# Patient Record
Sex: Male | Born: 1950 | Race: White | Hispanic: No | Marital: Married | Smoking: Never smoker
Health system: Southern US, Community
[De-identification: ages and names within clinical notes are randomized; demographics above are authoritative.]

## PROBLEM LIST (undated history)

## (undated) DIAGNOSIS — Z973 Presence of spectacles and contact lenses: Secondary | ICD-10-CM

## (undated) DIAGNOSIS — N400 Enlarged prostate without lower urinary tract symptoms: Secondary | ICD-10-CM

## (undated) DIAGNOSIS — I4819 Other persistent atrial fibrillation: Secondary | ICD-10-CM

## (undated) DIAGNOSIS — C61 Malignant neoplasm of prostate: Secondary | ICD-10-CM

## (undated) DIAGNOSIS — E785 Hyperlipidemia, unspecified: Secondary | ICD-10-CM

## (undated) DIAGNOSIS — R351 Nocturia: Secondary | ICD-10-CM

## (undated) DIAGNOSIS — G3184 Mild cognitive impairment, so stated: Secondary | ICD-10-CM

## (undated) HISTORY — DX: Other persistent atrial fibrillation: I48.19

## (undated) HISTORY — DX: Malignant neoplasm of prostate: C61

---
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
## 1994-02-12 HISTORY — PX: SEPTOPLASTY: SUR1290

## 2015-08-18 DIAGNOSIS — R350 Frequency of micturition: Secondary | ICD-10-CM | POA: Diagnosis not present

## 2015-08-18 DIAGNOSIS — Z125 Encounter for screening for malignant neoplasm of prostate: Secondary | ICD-10-CM | POA: Diagnosis not present

## 2015-08-18 DIAGNOSIS — Z8639 Personal history of other endocrine, nutritional and metabolic disease: Secondary | ICD-10-CM | POA: Diagnosis not present

## 2015-08-18 DIAGNOSIS — E7800 Pure hypercholesterolemia, unspecified: Secondary | ICD-10-CM | POA: Diagnosis not present

## 2015-08-18 DIAGNOSIS — Z1211 Encounter for screening for malignant neoplasm of colon: Secondary | ICD-10-CM | POA: Diagnosis not present

## 2015-08-18 DIAGNOSIS — Z1159 Encounter for screening for other viral diseases: Secondary | ICD-10-CM | POA: Diagnosis not present

## 2015-08-18 DIAGNOSIS — N401 Enlarged prostate with lower urinary tract symptoms: Secondary | ICD-10-CM | POA: Diagnosis not present

## 2015-08-18 DIAGNOSIS — Z01818 Encounter for other preprocedural examination: Secondary | ICD-10-CM | POA: Diagnosis not present

## 2015-08-26 DIAGNOSIS — N401 Enlarged prostate with lower urinary tract symptoms: Secondary | ICD-10-CM | POA: Diagnosis not present

## 2015-08-26 DIAGNOSIS — R300 Dysuria: Secondary | ICD-10-CM | POA: Diagnosis not present

## 2015-08-26 DIAGNOSIS — N3941 Urge incontinence: Secondary | ICD-10-CM | POA: Diagnosis not present

## 2015-08-26 DIAGNOSIS — R351 Nocturia: Secondary | ICD-10-CM | POA: Diagnosis not present

## 2015-09-06 DIAGNOSIS — N451 Epididymitis: Secondary | ICD-10-CM | POA: Diagnosis not present

## 2015-09-13 ENCOUNTER — Encounter (HOSPITAL_BASED_OUTPATIENT_CLINIC_OR_DEPARTMENT_OTHER): Payer: Self-pay | Admitting: *Deleted

## 2015-09-13 ENCOUNTER — Other Ambulatory Visit: Payer: Self-pay | Admitting: Urology

## 2015-09-13 DIAGNOSIS — R972 Elevated prostate specific antigen [PSA]: Secondary | ICD-10-CM | POA: Diagnosis not present

## 2015-09-13 DIAGNOSIS — C61 Malignant neoplasm of prostate: Secondary | ICD-10-CM | POA: Diagnosis not present

## 2015-09-13 NOTE — Progress Notes (Signed)
NPO AFTER MN.  ARRIVE AT 0930.  NEEDS HG.  WILL TAKE FLOMAX AM DOS W/ SIPS OF WATER.

## 2015-09-15 ENCOUNTER — Ambulatory Visit (HOSPITAL_BASED_OUTPATIENT_CLINIC_OR_DEPARTMENT_OTHER)
Admission: RE | Admit: 2015-09-15 | Discharge: 2015-09-15 | Disposition: A | Discharge status: Home / Self Care | Payer: PPO | Source: Ambulatory Visit | Attending: Urology | Admitting: Urology

## 2015-09-15 ENCOUNTER — Ambulatory Visit (HOSPITAL_BASED_OUTPATIENT_CLINIC_OR_DEPARTMENT_OTHER): Payer: PPO | Admitting: Anesthesiology

## 2015-09-15 ENCOUNTER — Encounter (HOSPITAL_BASED_OUTPATIENT_CLINIC_OR_DEPARTMENT_OTHER): Payer: Self-pay | Admitting: Anesthesiology

## 2015-09-15 ENCOUNTER — Encounter (HOSPITAL_BASED_OUTPATIENT_CLINIC_OR_DEPARTMENT_OTHER)
Admission: RE | Disposition: A | Discharge status: Home / Self Care | Payer: Self-pay | Source: Ambulatory Visit | Attending: Urology

## 2015-09-15 ENCOUNTER — Other Ambulatory Visit: Payer: Self-pay

## 2015-09-15 DIAGNOSIS — Z7982 Long term (current) use of aspirin: Secondary | ICD-10-CM | POA: Diagnosis not present

## 2015-09-15 DIAGNOSIS — N400 Enlarged prostate without lower urinary tract symptoms: Secondary | ICD-10-CM | POA: Diagnosis not present

## 2015-09-15 DIAGNOSIS — C61 Malignant neoplasm of prostate: Secondary | ICD-10-CM | POA: Diagnosis not present

## 2015-09-15 DIAGNOSIS — I4891 Unspecified atrial fibrillation: Secondary | ICD-10-CM | POA: Diagnosis not present

## 2015-09-15 DIAGNOSIS — R3914 Feeling of incomplete bladder emptying: Secondary | ICD-10-CM | POA: Diagnosis not present

## 2015-09-15 DIAGNOSIS — N3941 Urge incontinence: Secondary | ICD-10-CM | POA: Diagnosis not present

## 2015-09-15 DIAGNOSIS — R300 Dysuria: Secondary | ICD-10-CM | POA: Diagnosis not present

## 2015-09-15 DIAGNOSIS — N138 Other obstructive and reflux uropathy: Secondary | ICD-10-CM | POA: Insufficient documentation

## 2015-09-15 DIAGNOSIS — Z79899 Other long term (current) drug therapy: Secondary | ICD-10-CM | POA: Insufficient documentation

## 2015-09-15 DIAGNOSIS — N359 Urethral stricture, unspecified: Secondary | ICD-10-CM | POA: Insufficient documentation

## 2015-09-15 DIAGNOSIS — R351 Nocturia: Secondary | ICD-10-CM | POA: Diagnosis not present

## 2015-09-15 DIAGNOSIS — N401 Enlarged prostate with lower urinary tract symptoms: Secondary | ICD-10-CM | POA: Insufficient documentation

## 2015-09-15 DIAGNOSIS — N411 Chronic prostatitis: Secondary | ICD-10-CM | POA: Diagnosis not present

## 2015-09-15 HISTORY — DX: Nocturia: R35.1

## 2015-09-15 HISTORY — DX: Presence of spectacles and contact lenses: Z97.3

## 2015-09-15 HISTORY — DX: Hyperlipidemia, unspecified: E78.5

## 2015-09-15 HISTORY — PX: TRANSURETHRAL RESECTION OF PROSTATE: SHX73

## 2015-09-15 HISTORY — DX: Benign prostatic hyperplasia without lower urinary tract symptoms: N40.0

## 2015-09-15 LAB — POCT HEMOGLOBIN-HEMACUE: Hemoglobin: 13.7 g/dL (ref 13.0–17.0)

## 2015-09-15 SURGERY — TURP (TRANSURETHRAL RESECTION OF PROSTATE)
Anesthesia: General | Site: Prostate

## 2015-09-15 MED ORDER — DEXAMETHASONE SODIUM PHOSPHATE 10 MG/ML IJ SOLN
INTRAMUSCULAR | Status: AC
  Filled 2015-09-15: qty 1

## 2015-09-15 MED ORDER — PROPOFOL 10 MG/ML IV BOLUS
INTRAVENOUS | Status: DC | PRN
  Administered 2015-09-15: 140 mg via INTRAVENOUS

## 2015-09-15 MED ORDER — KETOROLAC TROMETHAMINE 30 MG/ML IJ SOLN
INTRAMUSCULAR | Status: AC
  Filled 2015-09-15: qty 1

## 2015-09-15 MED ORDER — FENTANYL CITRATE (PF) 100 MCG/2ML IJ SOLN
INTRAMUSCULAR | Status: AC
  Filled 2015-09-15: qty 2

## 2015-09-15 MED ORDER — PHENYLEPHRINE HCL 10 MG/ML IJ SOLN
INTRAMUSCULAR | Status: DC | PRN
  Administered 2015-09-15 (×3): 120 ug via INTRAVENOUS

## 2015-09-15 MED ORDER — PHENYLEPHRINE HCL 10 MG/ML IJ SOLN
INTRAVENOUS | Status: DC | PRN
  Administered 2015-09-15: 100 ug/min via INTRAVENOUS

## 2015-09-15 MED ORDER — CEFAZOLIN SODIUM-DEXTROSE 2-4 GM/100ML-% IV SOLN
INTRAVENOUS | Status: AC
Start: 2015-09-15 — End: 2015-09-15
  Filled 2015-09-15: qty 100

## 2015-09-15 MED ORDER — BELLADONNA ALKALOIDS-OPIUM 16.2-60 MG RE SUPP
RECTAL | Status: DC | PRN
  Administered 2015-09-15: 1 via RECTAL

## 2015-09-15 MED ORDER — KETOROLAC TROMETHAMINE 30 MG/ML IJ SOLN
30.0000 mg | Freq: Once | INTRAMUSCULAR | Status: AC
  Administered 2015-09-15: 30 mg via INTRAVENOUS
  Filled 2015-09-15: qty 1

## 2015-09-15 MED ORDER — MIDAZOLAM HCL 5 MG/5ML IJ SOLN
INTRAMUSCULAR | Status: DC | PRN
  Administered 2015-09-15 (×2): 1 mg via INTRAVENOUS

## 2015-09-15 MED ORDER — LIDOCAINE HCL (CARDIAC) 20 MG/ML IV SOLN
INTRAVENOUS | Status: DC | PRN
  Administered 2015-09-15: 80 mg via INTRAVENOUS

## 2015-09-15 MED ORDER — PHENYLEPHRINE 40 MCG/ML (10ML) SYRINGE FOR IV PUSH (FOR BLOOD PRESSURE SUPPORT)
PREFILLED_SYRINGE | INTRAVENOUS | Status: AC
  Filled 2015-09-15: qty 10

## 2015-09-15 MED ORDER — ACETAMINOPHEN 500 MG PO TABS
ORAL_TABLET | ORAL | Status: AC
  Filled 2015-09-15: qty 2

## 2015-09-15 MED ORDER — LIDOCAINE HCL 2 % EX GEL
CUTANEOUS | Status: DC | PRN
  Administered 2015-09-15: 1 via URETHRAL

## 2015-09-15 MED ORDER — CEFAZOLIN SODIUM-DEXTROSE 2-4 GM/100ML-% IV SOLN
2.0000 g | INTRAVENOUS | Status: AC
  Administered 2015-09-15: 2 g via INTRAVENOUS
  Filled 2015-09-15: qty 100

## 2015-09-15 MED ORDER — SODIUM CHLORIDE 0.9 % IR SOLN
Status: DC | PRN
  Administered 2015-09-15 (×2): 3000 mL via INTRAVESICAL

## 2015-09-15 MED ORDER — ONDANSETRON HCL 4 MG/2ML IJ SOLN
INTRAMUSCULAR | Status: AC
  Filled 2015-09-15: qty 2

## 2015-09-15 MED ORDER — PROMETHAZINE HCL 25 MG/ML IJ SOLN
6.2500 mg | INTRAMUSCULAR | Status: DC | PRN
  Filled 2015-09-15: qty 1

## 2015-09-15 MED ORDER — MIDAZOLAM HCL 2 MG/2ML IJ SOLN
INTRAMUSCULAR | Status: AC
  Filled 2015-09-15: qty 2

## 2015-09-15 MED ORDER — OXYCODONE-ACETAMINOPHEN 5-325 MG PO TABS: 1.0000 | ORAL_TABLET | Freq: Four times a day (QID) | ORAL | 0 refills | Status: DC | PRN

## 2015-09-15 MED ORDER — ACETAMINOPHEN 500 MG PO TABS
1000.0000 mg | ORAL_TABLET | Freq: Once | ORAL | Status: AC
  Administered 2015-09-15: 1000 mg via ORAL
  Filled 2015-09-15: qty 2

## 2015-09-15 MED ORDER — BACITRACIN-NEOMYCIN-POLYMYXIN 400-5-5000 EX OINT: 1.0000 "application " | TOPICAL_OINTMENT | Freq: Two times a day (BID) | CUTANEOUS | 0 refills | Status: DC

## 2015-09-15 MED ORDER — BELLADONNA ALKALOIDS-OPIUM 16.2-60 MG RE SUPP
RECTAL | Status: AC
  Filled 2015-09-15: qty 1

## 2015-09-15 MED ORDER — LIDOCAINE HCL (CARDIAC) 20 MG/ML IV SOLN
INTRAVENOUS | Status: AC
  Filled 2015-09-15: qty 5

## 2015-09-15 MED ORDER — FENTANYL CITRATE (PF) 100 MCG/2ML IJ SOLN
25.0000 ug | INTRAMUSCULAR | Status: DC | PRN
  Filled 2015-09-15: qty 1

## 2015-09-15 MED ORDER — ONDANSETRON HCL 4 MG/2ML IJ SOLN
INTRAMUSCULAR | Status: DC | PRN
  Administered 2015-09-15: 4 mg via INTRAVENOUS

## 2015-09-15 MED ORDER — UROGESIC-BLUE 81.6 MG PO TABS: ORAL_TABLET | ORAL | 3 refills | Status: DC

## 2015-09-15 MED ORDER — DOXYCYCLINE HYCLATE 50 MG PO CAPS: 100.0000 mg | ORAL_CAPSULE | Freq: Two times a day (BID) | ORAL | 0 refills | Status: DC

## 2015-09-15 MED ORDER — DEXAMETHASONE SODIUM PHOSPHATE 4 MG/ML IJ SOLN
INTRAMUSCULAR | Status: DC | PRN
  Administered 2015-09-15: 10 mg via INTRAVENOUS

## 2015-09-15 MED ORDER — PHENYLEPHRINE HCL 10 MG/ML IJ SOLN
INTRAMUSCULAR | Status: AC
Start: 2015-09-15 — End: 2015-09-15
  Filled 2015-09-15: qty 1

## 2015-09-15 MED ORDER — FENTANYL CITRATE (PF) 100 MCG/2ML IJ SOLN
INTRAMUSCULAR | Status: DC | PRN
  Administered 2015-09-15: 25 ug via INTRAVENOUS

## 2015-09-15 MED ORDER — LACTATED RINGERS IV SOLN
INTRAVENOUS | Status: DC
  Administered 2015-09-15: 10:00:00 via INTRAVENOUS
  Filled 2015-09-15: qty 1000

## 2015-09-15 MED ORDER — HYOSCYAMINE SULFATE 0.125 MG SL SUBL: 0.1250 mg | SUBLINGUAL_TABLET | SUBLINGUAL | 6 refills | Status: DC | PRN

## 2015-09-15 SURGICAL SUPPLY — 20 items
BAG DRAIN URO-CYSTO SKYTR STRL (DRAIN) ×18 IMPLANT
BAG URINE DRAINAGE (UROLOGICAL SUPPLIES) ×3 IMPLANT
CATH FOLEY 2WAY SLVR  5CC 20FR (CATHETERS) ×2
CATH FOLEY 2WAY SLVR 5CC 20FR (CATHETERS) ×1 IMPLANT
CLOTH BEACON ORANGE TIMEOUT ST (SAFETY) ×3 IMPLANT
GLOVE BIO SURGEON STRL SZ7.5 (GLOVE) ×3 IMPLANT
GOWN STRL REUS W/ TWL LRG LVL3 (GOWN DISPOSABLE) ×1 IMPLANT
GOWN STRL REUS W/ TWL XL LVL3 (GOWN DISPOSABLE) ×1 IMPLANT
GOWN STRL REUS W/TWL LRG LVL3 (GOWN DISPOSABLE) ×2
GOWN STRL REUS W/TWL XL LVL3 (GOWN DISPOSABLE) ×2
HOLDER FOLEY CATH W/STRAP (MISCELLANEOUS) ×3 IMPLANT
KIT ROOM TURNOVER WOR (KITS) ×3 IMPLANT
LOOP CUT BIPOLAR 24F LRG (ELECTROSURGICAL) ×3 IMPLANT
MANIFOLD NEPTUNE II (INSTRUMENTS) ×3 IMPLANT
PACK CYSTO (CUSTOM PROCEDURE TRAY) ×3 IMPLANT
SET ASPIRATION TUBING (TUBING) ×3 IMPLANT
SYR 30ML LL (SYRINGE) ×3 IMPLANT
SYRINGE IRR TOOMEY STRL 70CC (SYRINGE) ×3 IMPLANT
TUBE CONNECTING 12'X1/4 (SUCTIONS) ×1
TUBE CONNECTING 12X1/4 (SUCTIONS) ×2 IMPLANT

## 2015-09-15 NOTE — Anesthesia Preprocedure Evaluation (Addendum)
Anesthesia Evaluation  Patient identified by MRN, date of birth, ID band Patient awake    Reviewed: Allergy & Precautions, NPO status , Patient's Chart, lab work & pertinent test results  Airway Mallampati: II  TM Distance: >3 FB Neck ROM: Full    Dental  (+) Dental Advisory Given,    Pulmonary neg pulmonary ROS,    breath sounds clear to auscultation       Cardiovascular Exercise Tolerance: Good negative cardio ROS  + dysrhythmias Atrial Fibrillation  Rhythm:Regular Rate:Normal     Neuro/Psych negative neurological ROS     GI/Hepatic negative GI ROS, Neg liver ROS,   Endo/Other  negative endocrine ROS  Renal/GU negative Renal ROS     Musculoskeletal   Abdominal   Peds  Hematology negative hematology ROS (+)   Anesthesia Other Findings   Reproductive/Obstetrics                            Anesthesia Physical Anesthesia Plan  ASA: II  Anesthesia Plan: General   Post-op Pain Management:    Induction: Intravenous  Airway Management Planned: LMA  Additional Equipment:   Intra-op Plan:   Post-operative Plan: Extubation in OR  Informed Consent: I have reviewed the patients History and Physical, chart, labs and discussed the procedure including the risks, benefits and alternatives for the proposed anesthesia with the patient or authorized representative who has indicated his/her understanding and acceptance.   Dental advisory given  Plan Discussed with: CRNA  Anesthesia Plan Comments:         Anesthesia Quick Evaluation

## 2015-09-15 NOTE — Anesthesia Procedure Notes (Signed)
Procedure Name: LMA Insertion Date/Time: 09/15/2015 10:40 AM Performed by: Wanita Chamberlain Pre-anesthesia Checklist: Patient identified, Emergency Drugs available, Suction available, Patient being monitored and Timeout performed Patient Re-evaluated:Patient Re-evaluated prior to inductionOxygen Delivery Method: Circle system utilized Preoxygenation: Pre-oxygenation with 100% oxygen Intubation Type: IV induction Ventilation: Mask ventilation without difficulty LMA: LMA inserted LMA Size: 5.0 Number of attempts: 1 Placement Confirmation: CO2 detector and breath sounds checked- equal and bilateral Tube secured with: Tape Dental Injury: Teeth and Oropharynx as per pre-operative assessment

## 2015-09-15 NOTE — H&P (Signed)
Office Visit Report     08/26/2015   --------------------------------------------------------------------------------   Brady Reedy. Martinez  MRN: [id number]  PRIMARY CARE:  Brady Martinez. 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    LOCATION:  [facility] [phone]   --------------------------------------------------------------------------------   CC: I have symptoms of an enlarged prostate.  HPI: Brady Martinez is a 65 year-old male established patient who is here for symptoms of enlarged prostate.  He first noticed the symptoms approximately 08/12/2013. His symptoms have gotten worse over the last year. He has been treated with Flomax and Rapaflo. The patient has never had a surgical procedure for bladder outlet obstruction to his prostate.   He usually gets up at night to urinate 1 time. He does not have to wait a long time to start his urinary stream. He does not have to strain or bear down to start his urinary stream. He does have a good size and strength to his urinary stream. He does not dribble at the end of urination. He is having problems with emptying his bladder well.   He has not previously had an indwelling catheter in for more than two weeks at a time.   He has had a PSA done. He does not have any family members who have been diagnosed with prostate cancer.     AUA Symptom Score: He never has the sensation of not emptying his bladder completely after finishing urinating. 50% of the time he has to urinate again fewer than two hours after he has finished urinating. 50% of the time he has to start and stop again several times when he urinates. 50% of the time he finds it difficult to postpone urination. 50% of the time he has a weak urinary stream. Less than 50% of the time he has to push or strain to begin urination. He has to get up to urinate 2 times from the time he goes to bed until the time he  gets up in the morning.   Calculated AUA Symptom Score: 16    QOL Score: He would feel mostly dissatisfied if he had to live with his urinary condition the way it is now for the rest of his life.   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, unspecified, Inability to maintain erection - 09/30/2014 Urge incontinence, Urge incontinence of urine - 09/30/2014 Urgency of urination, Urinary urgency - 09/30/2014    NON-GU PMH: Encounter for general adult medical examination without abnormal findings, Encounter for preventive health examination - 09/27/2014 Personal history of other endocrine, nutritional and metabolic disease, History of hyperlipidemia - 08/27/2014    FAMILY HISTORY: Acute Myocardial Infarction - Runs In Family CAD (coronary artery disease) - Runs In Family Deceased - Runs In Family   SOCIAL HISTORY: Marital Status: Married Current Smoking Status: Patient has never smoked.  Has  never drank.  Drinks 4+ caffeinated drinks per day.     Notes: Caffeine use, No alcohol use, Retired, Never a smoker, Married   REVIEW OF SYSTEMS:    GU Review Male:   Patient reports frequent urination, hard to postpone urination, burning/ pain with urination, get up at night to urinate, leakage of urine, and stream starts and stops. Patient denies trouble starting your stream, have to strain to urinate , erection problems, and penile pain.  Gastrointestinal (Upper):   Patient denies nausea, vomiting, and indigestion/ heartburn.  Gastrointestinal  (Lower):   Patient denies diarrhea and constipation.  Constitutional:   Patient denies fever, night sweats, weight loss, and fatigue.  Skin:   Patient denies skin rash/ lesion and itching.  Eyes:   Patient denies blurred vision and double vision.  Ears/ Nose/ Throat:   Patient denies sore throat and sinus problems.  Hematologic/Lymphatic:   Patient denies easy bruising and swollen glands.  Cardiovascular:   Patient denies leg swelling and chest pains.  Respiratory:   Patient denies cough and shortness of breath.  Endocrine:   Patient denies excessive thirst.  Musculoskeletal:   Patient denies back pain and joint pain.  Neurological:   Patient denies headaches and dizziness.  Psychologic:   Patient denies depression and anxiety.   VITAL SIGNS:      08/26/2015 04:19 PM  BP 119/70 mmHg  Pulse 71 /min  Temperature 98.2 F / 37 C   GU PHYSICAL EXAMINATION:    Anus and Perineum: No hemorrhoids. No anal stenosis. No rectal fissure, no anal fissure. No edema, no dimple, no perineal tenderness, no anal tenderness.  Scrotum: No lesions. No edema. No cysts. No warts.  Epididymides: Right: no spermatocele, no masses, no cysts, no tenderness, no induration, no enlargement. Left: no spermatocele, no masses, no cysts, no tenderness, no induration, no enlargement.  Testes: No tenderness, no swelling, no enlargement left testes. No tenderness, no swelling, no enlargement right testes. Normal location left testes. Normal location right testes. No mass, no cyst, no varicocele, no hydrocele left testes. No mass, no cyst, no varicocele, no hydrocele right testes.  Urethral Meatus: Normal size. No lesion, no wart, no discharge, no polyp. Normal location.  Penis: Circumcised, no warts, no cracks. No dorsal Peyronie's plaques, no left corporal Peyronie's plaques, no right corporal Peyronie's plaques, no scarring, no warts. No balanitis, no meatal stenosis.  Prostate: 40 gram or 2+ size. Left lobe normal  consistency, right lobe normal consistency. Symmetrical lobes. No prostate nodule. Left lobe no tenderness, right lobe no tenderness.  Seminal Vesicles: Nonpalpable.  Sphincter Tone: Normal sphincter. No rectal tenderness. No rectal mass.    MULTI-SYSTEM PHYSICAL EXAMINATION:    Constitutional: Well-nourished. No physical deformities. Normally developed. Good grooming.  Neck: Neck symmetrical, not swollen. Normal tracheal position.  Respiratory: No labored breathing, no use of accessory muscles.   Cardiovascular: Normal temperature, normal extremity pulses, no swelling, no varicosities.  Lymphatic: No enlargement of neck, axillae, groin.  Skin: No paleness, no jaundice, no cyanosis. No lesion, no ulcer, no rash.  Neurologic / Psychiatric: Oriented to time, oriented to place, oriented to person. No depression, no anxiety, no agitation.  Gastrointestinal: No mass, no tenderness, no rigidity, non obese abdomen.  Eyes: Normal conjunctivae. Normal eyelids.  Ears, Nose, Mouth, and Throat: Left ear no scars, no lesions, no masses. Right ear no scars, no lesions, no masses. Nose no scars, no lesions, no masses. Normal hearing. Normal lips.  Musculoskeletal: Normal gait and station of  head and neck.     PAST DATA REVIEWED:  Source Of History:  Patient  Records Review:   AUA Symptom Score, Previous Patient Records  Urodynamics Review:   Review Bladder Scan, Review Flow Rate   08/27/14  PSA  Total PSA 2.47     PROCEDURES:         Prostate Ultrasound WF:[id number]  Length:5.08 Height:3.23 Width:5.41 Volume:46.48  Bladder:  PVR=69.16  Prostate:  Multiple cystic areas note, Largest = 0.79, which appears complex. Hypoechoic area seen in mid area without blood flow= 2.4x1.6x2.0cm. Calcifications seen.       The transrectal ultrasound probe is introduced into the rectum, and the prostate is visualized. Ultrasonography is utilized throughout the procedure. At the conclusion of the procedure, the  ultrasound probe is removed. The patient tolerates the procedure without complication.          Flow Rate - 51741  Flow Time: 0:28 min:sec  Voided Volume: 270 cc  Peak Flow Rate: 15 cc/sec  Time of Peak Flow: 0:07 min:sec  Average Flow Rate: 009 cc/sec  Total Void Time: 0:28 min:sec         Urinalysis - 81003 Dipstick Dipstick Cont'd  Specimen: Voided Bilirubin: Neg  Color: Yellow Ketones: Neg  Appearance: Clear Blood: Neg  Specific Gravity: 1.020 Protein: Neg  pH: 6.0 Urobilinogen: 0.2  Glucose: Neg Nitrites: Neg    Leukocyte Esterase: Neg    ASSESSMENT:      ICD-10 Details  1 GU:   BPH w/LUTS - N40.1   2   Dysuria - R30.0   3   Nocturia - R35.1   4   Urge incontinence - N39.41           Notes:   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 Today, he continues to complain of intermittent urinary frequency urgency and some urge incontinence, as well as nocturia. His urinalysis remains normal. He has a prostate volume of 46.48 cc, postvoid residual is 69.16 cc, with flow rate of 15 cc/s, with a voided volume of 270 cc. His prostate ultrasound, however, shows a distinct abnormalities with a cystic area identified, which is complex, measuring 2.4 x 1.6 x 2.0 cm. There is no blood flow within. There are calcifications noted. I have advised Dr. Esther Hardy to have prostate biopsy. If negative, would advise him to consider having a TURP. He is quite concerned with regard to urinary incontinence, and I reassured him with regard to that by reviewing the anatomy prostate and TURP. We will call him next week to arrange his prostate biopsy, with 20 mg Valium premed. His wife will accompany him. Cell number [phone].    PLAN:           Schedule Return Visit: Next Available Appointment  Return Visit: Next Available Appointment - TRUSP/BX          Document Letter(s):  Created for Patient:  Clinical Summary         Notes:   1. Continue Solution 2 tabs at bedtime   2 return to clinic for prostate ultrasound/prostate biopsy.   3. Call Pt to schedule [phone]   4. CCV: Dr. Yaakov Guthrie    Signed by Carolan Clines, M.D. on 08/26/15 at 5:57 PM (EDT)    Note: Pt treated for Left epididymitis with Rocephin, doxycycline.  Then had prostate biopsy with routine  Levaquin coverage. 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 ( <5%). Discussed with Pathology; discussed with Dr Louis Meckel and Dr. Alinda Money as outside opinions, and then with patient. In view of his significant bladder outlet symptoms (IPSS= 22) ( would not be a Radiation candidate anyway); and b/c of his low Gleason  Score and volume of CaP; we will proceed with TURP of the Left lateral prostatic lobe, and follow his PSA, and TURP pathology. He could still have radical prostatectomy or EBRT in the future if he desired, and if necessary for localized  prostate cancer Rx.. 47cc gland.   The information contained in this medical record document is considered private and confidential patient information. This information can only be used for the medical diagnosis and/or medical services that are being provided by the patient's selected caregivers. This information can only be distributed outside of the patient's care if the patient agrees and signs waivers of authorization for this information to be sent to an outside source or route.

## 2015-09-15 NOTE — Op Note (Signed)
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 Gaynelle Arabian, MD  First assistant:  None  Anesthesia:  Gen. LMA  Preparation: After appropriate preanesthesia, the patient was brought the operative room, placed on the operating table in the dorsal supine position where general LMA anesthesia was introduced. He was replaced in the dorsal lithotomy position with the pubis was prepped with Betadine solution and draped in usual fashion. The history was reviewed.  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  He first noticed the symptoms approximately 08/12/2013. His symptoms have gotten worse over the last year. He has been treated with Flomax and Rapaflo. The patient has never had a surgical procedure for bladder outlet obstruction to his prostate.   He usually gets up at night to urinate 1 time. He does not have to wait a long time to start his urinary stream. He does not have to strain or bear down to start his urinary stream. He does have a good size and strength to his urinary stream. He does not dribble at the end of urination. He is having problems with emptying his bladder well.   He has not previously had an indwelling catheter in for more than two weeks at a time.   He has had a PSA done. He does not have any family members who have been diagnosed with prostate cancer.     AUA Symptom Score: He never has the sensation of not emptying his bladder completely after finishing urinating. 50% of the time he has to urinate again fewer than two hours after he has finished urinating. 50% of the time he has to start and stop again  several times when he urinates. 50% of the time he finds it difficult to postpone urination. 50% of the time he has a weak urinary stream. Less than 50% of the time he has to push or strain to begin urination. He has to get up to urinate 2 times from the time he goes to bed until the time he gets up in the morning.   Calculated AUA Symptom Score: 16    QOL Score: He would feel mostly dissatisfied if he had to live with his urinary condition the way it is now for the rest of his life.   Calculated QOL Symptom Score: 4    Prostate ultrasound showed a 47 mL prostate, with abnormal appearing ostomy nodularity. Prostate biopsy showed single area of prostate cancer, Gleason 3+3, in less than 5% of the left lateral lobe. The patient has consented to TURP of the left side of the prostate, in order to affect an improved ability to void. He understands that he would be advised to have watchful waiting protocol for his minimal prostate cancer found. If necessary, he could still have radical prostatectomy in the future, or external beam radiation therapy.  Statement of  Likelihood of Success: Excellent. TIME-OUT observed.:  Procedure: The patient was noted to have incidental atrial fibrillation upon anesthetic evaluation, prior to induction. Atrial fibrillation did not have any effect on his blood pressure, or his heart rate.  The urethral meatus was noted to be stenotic, and was dilated to a 25 [place] with male sounds.  This allowed the 28 French resectoscope to be passed into the urethra. There were no urethral strictures noted. The Vero was identified and left in position for the entire procedure. The bladder neck was not elevated. The bladder itself showed a normal-appearing trigone and photodocumentation of the ureteral orifices was accomplished. Significant moderate trabeculation was identified, with saccule formation. There was no evidence of bladder stone, tumor, or diverticular formation,  however.  As discussed with the patient, resection of enlarged left lateral lobe of the prostate was accomplished, from the 1:00 to the 5:00 position. Cauterization was accomplished with minimal bleeding. Chips were evacuated from the bladder. A size 20 [place] two-way Foley catheter was placed to straight drainage. The patient was awakened after given IV Toradol, and taken to recovery room in good condition. He received a B and O suppository at the beginning of the procedure.

## 2015-09-15 NOTE — Interval H&P Note (Signed)
History and Physical Interval Note:  09/15/2015 10:21 AM  Brady Martinez  has presented today for surgery, with the diagnosis of BENIGN PROSTATIC HYPERPLASIA  The various methods of treatment have been discussed with the patient and family. After consideration of risks, benefits and other options for treatment, the patient has consented to  Procedure(s): TRANSURETHRAL RESECTION OF THE PROSTATE (TURP) (N/A) as a surgical intervention .  The patient's history has been reviewed, patient examined, no change in status, stable for surgery.  I have reviewed the patient's chart and labs.  Questions were answered to the patient's satisfaction.     [name] I Iyari Hagner

## 2015-09-15 NOTE — Transfer of Care (Signed)
Immediate Anesthesia Transfer of Care Note  Patient: Brady Martinez  Procedure(s) Performed: Procedure(s): TRANSURETHRAL RESECTION OF THE PROSTATE (TURP) (N/A)  Patient Location: PACU  Anesthesia Type:General  Level of Consciousness: awake, alert , oriented and patient cooperative  Airway & Oxygen Therapy: Patient Spontanous Breathing and Patient connected to nasal cannula oxygen  Post-op Assessment: Report given to RN and Post -op Vital signs reviewed and stable  Post vital signs: Reviewed and stable  Last Vitals:  Vitals:   09/15/15 0953  BP: 109/63  Pulse: 64  Resp: 14  Temp: 36.9 C    Last Pain:  Vitals:   09/15/15 0953  TempSrc: Oral      Patients Stated Pain Goal: 8 ([id number])  Complications: No apparent anesthesia complications

## 2015-09-15 NOTE — Discharge Instructions (Addendum)
Post Anesthesia Home Care Instructions  Activity: Get plenty of rest for the remainder of the day. A responsible adult should stay with you for 24 hours following the procedure.  For the next 24 hours, DO NOT: -Drive a car -Paediatric nurse -Drink alcoholic beverages -Take any medication unless instructed by your physician -Make any legal decisions or sign important papers.  Meals: Start with liquid foods such as gelatin or soup. Progress to regular foods as tolerated. Avoid greasy, spicy, heavy foods. If nausea and/or vomiting occur, drink only clear liquids until the nausea and/or vomiting subsides. Call your physician if vomiting continues.  Special Instructions/Symptoms: Your throat may feel dry or sore from the anesthesia or the breathing tube placed in your throat during surgery. If this causes discomfort, gargle with warm salt water. The discomfort should disappear within 24 hours.  If you had a scopolamine patch placed behind your ear for the management of post- operative nausea and/or vomiting:  1. The medication in the patch is effective for 72 hours, after which it should be removed.  Wrap patch in a tissue and discard in the trash. Wash hands thoroughly with soap and water. 2. You may remove the patch earlier than 72 hours if you experience unpleasant side effects which may include dry mouth, dizziness or visual disturbances. 3. Avoid touching the patch. Wash your hands with soap and water after contact with the patch.   Post transurethral resection of the prostate (TURP) instructions  Your recent prostate surgery requires very special post hospital care. Despite the fact that no skin incisions were used the area around the prostate incision is quite raw and is covered with a scab to promote healing and prevent bleeding. Certain cautions are needed to assure that the scab is not disturbed of the next 2-3 weeks while the healing proceeds.  Because the raw surface in your  prostate and the irritating effects of urine you may expect frequency of urination and/or urgency (a stronger desire to urinate) and perhaps even getting up at night more often. This will usually resolve or improve slowly over the healing period. You may see some blood in your urine over the first 6 weeks. Do not be alarmed, even if the urine was clear for a while. Get off your feet and drink lots of fluids until clearing occurs. If you start to pass clots or don't improve call us.  Diet:  You may return to your normal diet immediately. Because of the raw surface of your bladder, alcohol, spicy foods, foods high in acid and drinks with caffeine may cause irritation or frequency and should be used in moderation. To keep your urine flowing freely and avoid constipation, drink plenty of fluids during the day (8-10 glasses). Tip: Avoid cranberry juice because it is very acidic.  Activity:  Your physical activity doesn't need to be restricted. However, if you are very active, you may see some blood in the urine. We suggest that you reduce your activity under the circumstances until the bleeding has stopped.  Bowels:  It is important to keep your bowels regular during the postoperative period. Straining with bowel movements can cause bleeding. A bowel movement every other day is reasonable. Use a mild laxative if needed, such as milk of magnesia 2-3 tablespoons, or 2 Dulcolax tablets. Call if you continue to have problems. If you had been taking narcotics for pain, before, during or after your surgery, you may be constipated. Take a laxative if necessary.  Medication:  You  should resume your pre-surgery medications unless told not to. In addition you may be given an antibiotic to prevent or treat infection. Antibiotics are not always necessary. All medication should be taken as prescribed until the bottles are finished unless you are having an unusual reaction to one of the drugs.     Problems you  should report to [place]:  a. Fever greater than 101F. b. Heavy bleeding, or clots (see notes above about blood in urine). c. Inability to urinate. d. Drug reactions (hives, rash, nausea, vomiting, diarrhea). e. Severe burning or pain with urination that is not improving.  May remove catheter Sat.or Sun at home if urine clear.

## 2015-09-15 NOTE — Anesthesia Postprocedure Evaluation (Signed)
Anesthesia Post Note  Patient: Maloy Aroche  Procedure(s) Performed: Procedure(s) (LRB): TRANSURETHRAL RESECTION OF THE PROSTATE (TURP) (N/A)  Patient location during evaluation: PACU Anesthesia Type: General Level of consciousness: awake and alert Pain management: pain level controlled Vital Signs Assessment: post-procedure vital signs reviewed and stable Respiratory status: spontaneous breathing, nonlabored ventilation, respiratory function stable and patient connected to nasal cannula oxygen Cardiovascular status: blood pressure returned to baseline and stable Postop Assessment: no signs of nausea or vomiting Anesthetic complications: no Comments: Pt with new onset afib once hooked up to monitors in OR. 12-lead EKG confirms diagnosis in PACU. Pt remains asymptomatic in recovery room. Discussed with pt and pt's wife that follow-up with cardiologist is recommended for further evaluation and treatment of afib. Both expressed understanding and plan on making an appointment as soon as possible.    Last Vitals:  Vitals:   09/15/15 1245 09/15/15 1300  BP: 95/65 (!) 98/56  Pulse: 60 (!) 49  Resp: 15 12  Temp:      Last Pain:  Vitals:   09/15/15 1300  TempSrc:   PainSc: 0-No pain                 Tiajuana Amass

## 2015-09-16 ENCOUNTER — Encounter (HOSPITAL_BASED_OUTPATIENT_CLINIC_OR_DEPARTMENT_OTHER): Payer: Self-pay | Admitting: Urology

## 2015-09-16 DIAGNOSIS — I499 Cardiac arrhythmia, unspecified: Secondary | ICD-10-CM | POA: Diagnosis not present

## 2015-11-04 DIAGNOSIS — N401 Enlarged prostate with lower urinary tract symptoms: Secondary | ICD-10-CM | POA: Diagnosis not present

## 2015-11-04 DIAGNOSIS — C61 Malignant neoplasm of prostate: Secondary | ICD-10-CM | POA: Diagnosis not present

## 2015-11-04 DIAGNOSIS — Z0000 Encounter for general adult medical examination without abnormal findings: Secondary | ICD-10-CM | POA: Diagnosis not present

## 2015-11-04 DIAGNOSIS — E569 Vitamin deficiency, unspecified: Secondary | ICD-10-CM | POA: Diagnosis not present

## 2015-11-04 DIAGNOSIS — E7800 Pure hypercholesterolemia, unspecified: Secondary | ICD-10-CM | POA: Diagnosis not present

## 2015-11-04 DIAGNOSIS — Z8639 Personal history of other endocrine, nutritional and metabolic disease: Secondary | ICD-10-CM | POA: Diagnosis not present

## 2016-04-24 DIAGNOSIS — Z8639 Personal history of other endocrine, nutritional and metabolic disease: Secondary | ICD-10-CM | POA: Diagnosis not present

## 2016-04-24 DIAGNOSIS — I499 Cardiac arrhythmia, unspecified: Secondary | ICD-10-CM | POA: Diagnosis not present

## 2016-04-24 DIAGNOSIS — E7800 Pure hypercholesterolemia, unspecified: Secondary | ICD-10-CM | POA: Diagnosis not present

## 2016-04-24 DIAGNOSIS — E559 Vitamin D deficiency, unspecified: Secondary | ICD-10-CM | POA: Diagnosis not present

## 2016-04-24 DIAGNOSIS — C61 Malignant neoplasm of prostate: Secondary | ICD-10-CM | POA: Diagnosis not present

## 2016-05-01 DIAGNOSIS — I499 Cardiac arrhythmia, unspecified: Secondary | ICD-10-CM | POA: Diagnosis not present

## 2016-05-01 DIAGNOSIS — N401 Enlarged prostate with lower urinary tract symptoms: Secondary | ICD-10-CM | POA: Diagnosis not present

## 2016-05-01 DIAGNOSIS — E7800 Pure hypercholesterolemia, unspecified: Secondary | ICD-10-CM | POA: Diagnosis not present

## 2016-05-01 DIAGNOSIS — C61 Malignant neoplasm of prostate: Secondary | ICD-10-CM | POA: Diagnosis not present

## 2016-05-01 DIAGNOSIS — E559 Vitamin D deficiency, unspecified: Secondary | ICD-10-CM | POA: Diagnosis not present

## 2016-05-04 DIAGNOSIS — R972 Elevated prostate specific antigen [PSA]: Secondary | ICD-10-CM | POA: Diagnosis not present

## 2016-05-04 DIAGNOSIS — N401 Enlarged prostate with lower urinary tract symptoms: Secondary | ICD-10-CM | POA: Diagnosis not present

## 2016-05-04 DIAGNOSIS — R351 Nocturia: Secondary | ICD-10-CM | POA: Diagnosis not present

## 2016-11-02 DIAGNOSIS — E559 Vitamin D deficiency, unspecified: Secondary | ICD-10-CM | POA: Diagnosis not present

## 2016-11-02 DIAGNOSIS — Z0000 Encounter for general adult medical examination without abnormal findings: Secondary | ICD-10-CM | POA: Diagnosis not present

## 2016-11-02 DIAGNOSIS — E7800 Pure hypercholesterolemia, unspecified: Secondary | ICD-10-CM | POA: Diagnosis not present

## 2016-11-02 DIAGNOSIS — C61 Malignant neoplasm of prostate: Secondary | ICD-10-CM | POA: Diagnosis not present

## 2016-11-02 DIAGNOSIS — Z8639 Personal history of other endocrine, nutritional and metabolic disease: Secondary | ICD-10-CM | POA: Diagnosis not present

## 2016-11-07 DIAGNOSIS — N401 Enlarged prostate with lower urinary tract symptoms: Secondary | ICD-10-CM | POA: Diagnosis not present

## 2016-11-07 DIAGNOSIS — C61 Malignant neoplasm of prostate: Secondary | ICD-10-CM | POA: Diagnosis not present

## 2016-11-07 DIAGNOSIS — R351 Nocturia: Secondary | ICD-10-CM | POA: Diagnosis not present

## 2017-03-07 ENCOUNTER — Other Ambulatory Visit: Payer: Self-pay | Admitting: Urology

## 2017-03-07 DIAGNOSIS — C61 Malignant neoplasm of prostate: Secondary | ICD-10-CM

## 2017-04-22 DIAGNOSIS — Z8639 Personal history of other endocrine, nutritional and metabolic disease: Secondary | ICD-10-CM | POA: Diagnosis not present

## 2017-04-22 DIAGNOSIS — C61 Malignant neoplasm of prostate: Secondary | ICD-10-CM | POA: Diagnosis not present

## 2017-04-22 DIAGNOSIS — I499 Cardiac arrhythmia, unspecified: Secondary | ICD-10-CM | POA: Diagnosis not present

## 2017-04-22 DIAGNOSIS — E559 Vitamin D deficiency, unspecified: Secondary | ICD-10-CM | POA: Diagnosis not present

## 2017-04-22 DIAGNOSIS — E7800 Pure hypercholesterolemia, unspecified: Secondary | ICD-10-CM | POA: Diagnosis not present

## 2017-04-29 ENCOUNTER — Ambulatory Visit: Payer: PPO | Admitting: Internal Medicine

## 2017-05-06 ENCOUNTER — Ambulatory Visit: Payer: PPO | Admitting: Internal Medicine

## 2017-05-06 ENCOUNTER — Encounter: Payer: Self-pay | Admitting: Internal Medicine

## 2017-05-06 VITALS — BP 128/56 | HR 64 | Ht 74.0 in | Wt 200.5 lb

## 2017-05-06 DIAGNOSIS — I4891 Unspecified atrial fibrillation: Secondary | ICD-10-CM | POA: Diagnosis not present

## 2017-05-06 DIAGNOSIS — I481 Persistent atrial fibrillation: Secondary | ICD-10-CM | POA: Diagnosis not present

## 2017-05-06 DIAGNOSIS — I4819 Other persistent atrial fibrillation: Secondary | ICD-10-CM

## 2017-05-06 NOTE — Patient Instructions (Addendum)
Medication Instructions:  Your physician recommends that you continue on your current medications as directed. Please refer to the Current Medication list given to you today.  Labwork: None ordered.  Testing/Procedures: Your physician has requested that you have an echocardiogram. Echocardiography is a painless test that uses sound waves to create images of your heart. It provides your doctor with information about the size and shape of your heart and how well your heart's chambers and valves are working. This procedure takes approximately one hour. There are no restrictions for this procedure.  Please schedule for an ECHO.  Follow-Up: Your physician wants you to follow-up in: 4 weeks with Dr. Rayann Heman.   Any Other Special Instructions Will Be Listed Below (If Applicable).  Review article- [facility] in the [place].  If you need a refill on your cardiac medications before your next appointment, please call your pharmacy.

## 2017-05-06 NOTE — Progress Notes (Signed)
Electrophysiology Office Note   Date:  05/07/2017   ID:  Zygmunt Mcglinn, DOB 06/14/1950, MRN [id number]  PCP:  Vernie Shanks, MD   Primary Electrophysiologist: Thompson Grayer, MD    CC: afib   History of Present Illness: Brady Martinez is a 67 y.o. male who presents today for electrophysiology evaluation.   He is referred by Dr Jacelyn Grip for EP consultation regarding atrial fibrillation.  The patient presented for routine evaluation 3/11/9 and was found on ekg to have asymptomatic, rate controlled atrial fibrillation .  The duration of atrial fibrillation is unknown. The patient reports that he is very active.  He exercises regularly without limitation. Today, he denies symptoms of palpitations, chest pain, shortness of breath, orthopnea, PND, lower extremity edema, claudication, dizziness, presyncope, syncope, bleeding, or neurologic sequela. The patient is tolerating medications without difficulties and is otherwise without complaint today.    Past Medical History:  Diagnosis Date  . BPH (benign prostatic hyperplasia)   . Hyperlipidemia   . Nocturia   . Persistent atrial fibrillation ([place])   . Prostate cancer ([place])   . Wears glasses    Past Surgical History:  Procedure Laterality Date  . SEPTOPLASTY  1996  . TONSILLECTOMY  1955  . TRANSURETHRAL RESECTION OF PROSTATE N/A 09/15/2015   Procedure: TRANSURETHRAL RESECTION OF THE PROSTATE (TURP);  Surgeon: Carolan Clines, MD;  Location: [facility];  Service: Urology;  Laterality: N/A;     Current Outpatient Medications  Medication Sig Dispense Refill  . Ascorbic Acid (VITAMIN C) 1000 MG tablet Take 1,000 mg by mouth daily.    . Calcium-Magnesium-Vitamin D (CALCIUM 500 PO) Take 1 tablet by mouth daily.    . Cholecalciferol (VITAMIN D3) 5000 units CAPS Take 1 capsule by mouth daily.    . Omega-3 Fatty Acids (FISH OIL) 1200 MG CAPS Take 1 capsule by mouth daily.    . pravastatin (PRAVACHOL) 10 MG tablet Take 10 mg  by mouth every evening.    . Turmeric 500 MG CAPS Take 1 capsule by mouth daily.    . vitamin E 400 UNIT capsule Take 400 Units by mouth daily.     No current facility-administered medications for this visit.     Allergies:   Patient has no known allergies.   Social History:  The patient  reports that he has never smoked. He has never used smokeless tobacco. He reports that he does not drink alcohol or use drugs.   Family History:  Denies FH of early onset afib   ROS:  Please see the history of present illness.   All other systems are personally reviewed and negative.    PHYSICAL EXAM: VS:  BP (!) 128/56   Pulse 64   Ht 6\' 2"  (1.88 m)   Wt 200 lb 8 oz (90.9 kg)   SpO2 99%   BMI 25.74 kg/m  , BMI Body mass index is 25.74 kg/m. GEN: Well nourished, well developed, in no acute distress  HEENT: normal  Neck: no JVD, carotid bruits, or masses Cardiac: iRRR; no murmurs, rubs, or gallops,no edema  Respiratory:  clear to auscultation bilaterally, normal work of breathing GI: soft, nontender, nondistended, + BS MS: no deformity or atrophy  Skin: warm and dry  Neuro:  Strength and sensation are intact Psych: euthymic mood, full affect  EKG:  EKG is ordered today. The ekg ordered today is personally reviewed and shows afib with V rate 75 bpm   Records from Dr Sabas Sous office including  ekgs and labs are reviewed at length with the patient today  Wt Readings from Last 3 Encounters:  05/06/17 200 lb 8 oz (90.9 kg)  09/15/15 200 lb (90.7 kg)      ASSESSMENT AND PLAN:  1.  Persistent atrial fibrillation The patient has rate controlled persistent afib.  He is currently unaware of symptoms. chads2vasc score is 1.  We discussed AHA/ACC/HRS guidelines for anticoagualtion which for him would allow options of no anticoagulation, ASA, or DOAC.  We discussed this at length.  I also discussed AVERROES trial with him and have encouraged him to read this trial further as I do think that it would  be reasonable to pursue long term anticoagualtion if he decides to do so. I will obtain an echo Return in 4 weeks We may consider initiation of anticoagulation and cardioversion on return. I would currently not advise AAD therapy.  Given paucity of symptoms, I would also not advise ablation at this time.   Follow-up:  Return in 4 weeks  Current medicines are reviewed at length with the patient today.   The patient does not have concerns regarding his medicines.  The following changes were made today:  none  Labs/ tests ordered today include:  Orders Placed This Encounter  Procedures  . EKG 12-Lead  . ECHOCARDIOGRAM COMPLETE     Signed, Thompson Grayer, MD   [address] [place] [address] [phone] (office) [phone] (fax)

## 2017-05-07 ENCOUNTER — Ambulatory Visit
Admission: RE | Admit: 2017-05-07 | Discharge: 2017-05-07 | Disposition: A | Discharge status: Home / Self Care | Payer: PPO | Source: Ambulatory Visit | Attending: Urology | Admitting: Urology

## 2017-05-07 ENCOUNTER — Encounter: Payer: Self-pay | Admitting: Internal Medicine

## 2017-05-07 DIAGNOSIS — C61 Malignant neoplasm of prostate: Secondary | ICD-10-CM | POA: Diagnosis not present

## 2017-05-07 MED ORDER — GADOBENATE DIMEGLUMINE 529 MG/ML IV SOLN
19.0000 mL | Freq: Once | INTRAVENOUS | Status: AC | PRN
  Administered 2017-05-07: 19 mL via INTRAVENOUS

## 2017-05-08 ENCOUNTER — Other Ambulatory Visit: Payer: Self-pay

## 2017-05-08 ENCOUNTER — Telehealth: Payer: Self-pay | Admitting: Internal Medicine

## 2017-05-08 ENCOUNTER — Ambulatory Visit (HOSPITAL_COMMUNITY): Payer: PPO | Attending: Cardiology

## 2017-05-08 DIAGNOSIS — I4819 Other persistent atrial fibrillation: Secondary | ICD-10-CM

## 2017-05-08 DIAGNOSIS — E785 Hyperlipidemia, unspecified: Secondary | ICD-10-CM | POA: Insufficient documentation

## 2017-05-08 DIAGNOSIS — I081 Rheumatic disorders of both mitral and tricuspid valves: Secondary | ICD-10-CM | POA: Insufficient documentation

## 2017-05-08 DIAGNOSIS — I4891 Unspecified atrial fibrillation: Secondary | ICD-10-CM | POA: Diagnosis not present

## 2017-05-08 NOTE — Telephone Encounter (Signed)
New message    Pt wants to know if he should start taking Eliquis now or if he should wait 5 weeks until he sees Dr. Rayann Heman again?

## 2017-05-10 DIAGNOSIS — C61 Malignant neoplasm of prostate: Secondary | ICD-10-CM | POA: Diagnosis not present

## 2017-05-10 NOTE — Telephone Encounter (Signed)
Per pharmacy, Pt will need lab work prior to starting anticoagulant (no labs on file).  Scheduling to call Pt today to schedule f/u with Dr. Rayann Heman and will schedule lab appt.  After lab work received Pt may start medication.

## 2017-05-13 MED ORDER — APIXABAN 5 MG PO TABS: 5.0000 mg | ORAL_TABLET | Freq: Two times a day (BID) | ORAL | 3 refills | Status: DC

## 2017-05-13 MED ORDER — APIXABAN 5 MG PO TABS: 5.0000 mg | ORAL_TABLET | Freq: Two times a day (BID) | ORAL | 11 refills | Status: DC

## 2017-05-13 NOTE — Telephone Encounter (Signed)
Labs scanned in to Epic.  Ordered Eliquis 5 mg BID for Pt.  Pt notified. Pt ECHO results called to Pt.  Pt indicates understanding.   Pt will need f/u in 4 weeks, scheduling aware.

## 2017-05-29 ENCOUNTER — Encounter: Payer: Self-pay | Admitting: Internal Medicine

## 2017-06-10 ENCOUNTER — Encounter: Payer: Self-pay | Admitting: Internal Medicine

## 2017-06-10 ENCOUNTER — Encounter (INDEPENDENT_AMBULATORY_CARE_PROVIDER_SITE_OTHER): Payer: Self-pay

## 2017-06-10 ENCOUNTER — Ambulatory Visit: Payer: PPO | Admitting: Internal Medicine

## 2017-06-10 VITALS — BP 120/64 | HR 60 | Ht 74.0 in | Wt 209.0 lb

## 2017-06-10 DIAGNOSIS — I4819 Other persistent atrial fibrillation: Secondary | ICD-10-CM

## 2017-06-10 DIAGNOSIS — I481 Persistent atrial fibrillation: Secondary | ICD-10-CM | POA: Diagnosis not present

## 2017-06-10 DIAGNOSIS — Z01812 Encounter for preprocedural laboratory examination: Secondary | ICD-10-CM | POA: Diagnosis not present

## 2017-06-10 NOTE — Patient Instructions (Addendum)
Medication Instructions:  Your physician recommends that you continue on your current medications as directed. Please refer to the Current Medication list given to you today.  Labwork: Pre procedure lab work today: BMET & CBC w/ diff  Testing/Procedures: Your physician has recommended that you have a Cardioversion (DCCV). Electrical Cardioversion uses a jolt of electricity to your heart either through paddles or wired patches attached to your chest. This is a controlled, usually prescheduled, procedure. Defibrillation is done under light anesthesia in the hospital, and you usually go home the day of the procedure. This is done to get your heart back into a normal rhythm. You are not awake for the procedure.   Cardioversion instructions: Please arrive at the [facility] main entrance of [facility] hospital on   06/13/2017 at 11:00 a.m. Do not eat or drink after midnight prior to procedure Take your medications as normal the morning of your procedure with a sip of water. You will need someone to drive you home at discharge  Follow-Up: Your physician recommends that you schedule a follow-up appointment in: 5-6 weeks with Dr. Rayann Heman.  * If you need a refill on your cardiac medications before your next appointment, please call your pharmacy.   *Please note that any paperwork needing to be filled out by the provider will need to be addressed at the front desk prior to seeing the provider. Please note that any FMLA, disability or other documents regarding health condition is subject to a $25.00 charge that must be received prior to completion of paperwork in the form of a money order or check.  Thank you for choosing [facility]!!   Trinidad Curet, RN [phone]  Any Other Special Instructions Will Be Listed Below (If Applicable).   Electrical Cardioversion Electrical cardioversion is the delivery of a jolt of electricity to restore a normal rhythm to the heart. A rhythm that is too  fast or is not regular keeps the heart from pumping well. In this procedure, sticky patches or metal paddles are placed on the chest to deliver electricity to the heart from a device. This procedure may be done in an emergency if:  There is low or no blood pressure as a result of the heart rhythm.  Normal rhythm must be restored as fast as possible to protect the brain and heart from further damage.  It may save a life.  This procedure may also be done for irregular or fast heart rhythms that are not immediately life-threatening. Tell a health care provider about:  Any allergies you have.  All medicines you are taking, including vitamins, herbs, eye drops, creams, and over-the-counter medicines.  Any problems you or family members have had with anesthetic medicines.  Any blood disorders you have.  Any surgeries you have had.  Any medical conditions you have.  Whether you are pregnant or may be pregnant. What are the risks? Generally, this is a safe procedure. However, problems may occur, including:  Allergic reactions to medicines.  A blood clot that breaks free and travels to other parts of your body.  The possible return of an abnormal heart rhythm within hours or days after the procedure.  Your heart stopping (cardiac arrest). This is rare.  What happens before the procedure? Medicines  Your health care provider may have you start taking: ? Blood-thinning medicines (anticoagulants) so your blood does not clot as easily. ? Medicines may be given to help stabilize your heart rate and rhythm.  Ask your health care provider  about changing or stopping your regular medicines. This is especially important if you are taking diabetes medicines or blood thinners. General instructions  Plan to have someone take you home from the hospital or clinic.  If you will be going home right after the procedure, plan to have someone with you for 24 hours.  Follow instructions from  your health care provider about eating or drinking restrictions. What happens during the procedure?  To lower your risk of infection: ? Your health care team will wash or sanitize their hands. ? Your skin will be washed with soap.  An IV tube will be inserted into one of your veins.  You will be given a medicine to help you relax (sedative).  Sticky patches (electrodes) or metal paddles may be placed on your chest.  An electrical shock will be delivered. The procedure may vary among health care providers and hospitals. What happens after the procedure?  Your blood pressure, heart rate, breathing rate, and blood oxygen level will be monitored until the medicines you were given have worn off.  Do not drive for 24 hours if you were given a sedative.  Your heart rhythm will be watched to make sure it does not change. This information is not intended to replace advice given to you by your health care provider. Make sure you discuss any questions you have with your health care provider. Document Released: 01/19/2002 Document Revised: 09/28/2015 Document Reviewed: 08/05/2015 Elsevier Interactive Patient Education  2017 [employer].

## 2017-06-10 NOTE — H&P (View-Only) (Signed)
   PCP: Vernie Shanks, MD   Primary EP: Dr Rayann Heman  Brady Martinez is a 67 y.o. male who presents today for routine electrophysiology followup.  Since last being seen in our clinic, the patient reports doing very well.  He has only minor SOB with moderate exercise.  This appears to be age appropriate.  Today, he denies symptoms of palpitations, chest pain, lower extremity edema, dizziness, presyncope, or syncope.  The patient is otherwise without complaint today.   Past Medical History:  Diagnosis Date  . BPH (benign prostatic hyperplasia)   . Hyperlipidemia   . Nocturia   . Persistent atrial fibrillation ([place])   . Prostate cancer ([place])   . Wears glasses    Past Surgical History:  Procedure Laterality Date  . SEPTOPLASTY  1996  . TONSILLECTOMY  1955  . TRANSURETHRAL RESECTION OF PROSTATE N/A 09/15/2015   Procedure: TRANSURETHRAL RESECTION OF THE PROSTATE (TURP);  Surgeon: Carolan Clines, MD;  Location: [facility];  Service: Urology;  Laterality: N/A;    ROS- all systems are reviewed and negatives except as per HPI above  Current Outpatient Medications  Medication Sig Dispense Refill  . apixaban (ELIQUIS) 5 MG TABS tablet Take 1 tablet (5 mg total) by mouth 2 (two) times daily. 180 tablet 3  . Ascorbic Acid (VITAMIN C) 1000 MG tablet Take 1,000 mg by mouth daily.    . Calcium-Magnesium-Vitamin D (CALCIUM 500 PO) Take 1 tablet by mouth daily.    . Cholecalciferol (VITAMIN D3) 5000 units CAPS Take 1 capsule by mouth daily.    . Omega-3 Fatty Acids (FISH OIL) 1200 MG CAPS Take 1 capsule by mouth daily.    . pravastatin (PRAVACHOL) 10 MG tablet Take 10 mg by mouth every evening.    . Turmeric 500 MG CAPS Take 1 capsule by mouth daily.    . vitamin E 400 UNIT capsule Take 400 Units by mouth daily.     No current facility-administered medications for this visit.     Physical Exam: Vitals:   06/10/17 1114  BP: 120/64  Pulse: 60  Weight: 209 lb (94.8 kg)    Height: 6\' 2"  (1.88 m)    GEN- The patient is well appearing, alert and oriented x 3 today.   Head- normocephalic, atraumatic Eyes-  Sclera clear, conjunctiva pink Ears- hearing intact Oropharynx- clear Lungs- Clear to ausculation bilaterally, normal work of breathing Heart- irregular rate and rhythm, no murmurs, rubs or gallops, PMI not laterally displaced GI- soft, NT, ND, + BS Extremities- no clubbing, cyanosis, or edema   Echo 05/08/17 reveals EF 60%, mild biatrial enlargement, LA 71mm, mild TR, mild MR  Assessment and Plan:  1. Persistent afib Doing well with rate control I am not convinced that he would benefit clinically from sinus rhythm long term. I think the next appropriate step is cardioversion.  If he feels significantly better with sinus then we may be more justified in consideration of AAD therapy at that point.  As per guidelines, would not pursue afib ablation without a trial of AAD therapy first. chads2vasc score is 1.  On eliquis  Return 4-5 weeks post cardioversion to reassess  Thompson Grayer MD, [facility] 06/10/2017 11:21 AM

## 2017-06-10 NOTE — Progress Notes (Signed)
   PCP: Vernie Shanks, MD   Primary EP: Dr Rayann Heman  Brady Martinez is a 67 y.o. male who presents today for routine electrophysiology followup.  Since last being seen in our clinic, the patient reports doing very well.  He has only minor SOB with moderate exercise.  This appears to be age appropriate.  Today, he denies symptoms of palpitations, chest pain, lower extremity edema, dizziness, presyncope, or syncope.  The patient is otherwise without complaint today.   Past Medical History:  Diagnosis Date  . BPH (benign prostatic hyperplasia)   . Hyperlipidemia   . Nocturia   . Persistent atrial fibrillation ([place])   . Prostate cancer ([place])   . Wears glasses    Past Surgical History:  Procedure Laterality Date  . SEPTOPLASTY  1996  . TONSILLECTOMY  1955  . TRANSURETHRAL RESECTION OF PROSTATE N/A 09/15/2015   Procedure: TRANSURETHRAL RESECTION OF THE PROSTATE (TURP);  Surgeon: Carolan Clines, MD;  Location: [facility];  Service: Urology;  Laterality: N/A;    ROS- all systems are reviewed and negatives except as per HPI above  Current Outpatient Medications  Medication Sig Dispense Refill  . apixaban (ELIQUIS) 5 MG TABS tablet Take 1 tablet (5 mg total) by mouth 2 (two) times daily. 180 tablet 3  . Ascorbic Acid (VITAMIN C) 1000 MG tablet Take 1,000 mg by mouth daily.    . Calcium-Magnesium-Vitamin D (CALCIUM 500 PO) Take 1 tablet by mouth daily.    . Cholecalciferol (VITAMIN D3) 5000 units CAPS Take 1 capsule by mouth daily.    . Omega-3 Fatty Acids (FISH OIL) 1200 MG CAPS Take 1 capsule by mouth daily.    . pravastatin (PRAVACHOL) 10 MG tablet Take 10 mg by mouth every evening.    . Turmeric 500 MG CAPS Take 1 capsule by mouth daily.    . vitamin E 400 UNIT capsule Take 400 Units by mouth daily.     No current facility-administered medications for this visit.     Physical Exam: Vitals:   06/10/17 1114  BP: 120/64  Pulse: 60  Weight: 209 lb (94.8 kg)    Height: 6\' 2"  (1.88 m)    GEN- The patient is well appearing, alert and oriented x 3 today.   Head- normocephalic, atraumatic Eyes-  Sclera clear, conjunctiva pink Ears- hearing intact Oropharynx- clear Lungs- Clear to ausculation bilaterally, normal work of breathing Heart- irregular rate and rhythm, no murmurs, rubs or gallops, PMI not laterally displaced GI- soft, NT, ND, + BS Extremities- no clubbing, cyanosis, or edema   Echo 05/08/17 reveals EF 60%, mild biatrial enlargement, LA 52mm, mild TR, mild MR  Assessment and Plan:  1. Persistent afib Doing well with rate control I am not convinced that he would benefit clinically from sinus rhythm long term. I think the next appropriate step is cardioversion.  If he feels significantly better with sinus then we may be more justified in consideration of AAD therapy at that point.  As per guidelines, would not pursue afib ablation without a trial of AAD therapy first. chads2vasc score is 1.  On eliquis  Return 4-5 weeks post cardioversion to reassess  Thompson Grayer MD, [facility] 06/10/2017 11:21 AM

## 2017-06-11 LAB — BASIC METABOLIC PANEL
BUN/Creatinine Ratio: 25 — ABNORMAL HIGH (ref 10–24)
BUN: 27 mg/dL (ref 8–27)
CALCIUM: 9.5 mg/dL (ref 8.6–10.2)
CO2: 22 mmol/L (ref 20–29)
CREATININE: 1.07 mg/dL (ref 0.76–1.27)
Chloride: 104 mmol/L (ref 96–106)
GFR calc Af Amer: 82 mL/min/{1.73_m2} (ref >59)
GFR calc non Af Amer: 71 mL/min/{1.73_m2} (ref >59)
GLUCOSE: 87 mg/dL (ref 65–99)
Potassium: 4.4 mmol/L (ref 3.5–5.2)
Sodium: 141 mmol/L (ref 134–144)

## 2017-06-11 LAB — CBC WITH DIFFERENTIAL/PLATELET
BASOS ABS: 0 10*3/uL (ref 0.0–0.2)
Basos: 0 %
EOS (ABSOLUTE): 0.1 10*3/uL (ref 0.0–0.4)
EOS: 1 %
HEMOGLOBIN: 14 g/dL (ref 13.0–17.7)
Hematocrit: 42.9 % (ref 37.5–51.0)
IMMATURE GRANS (ABS): 0 10*3/uL (ref 0.0–0.1)
IMMATURE GRANULOCYTES: 0 %
LYMPHS: 36 %
Lymphocytes Absolute: 1.8 10*3/uL (ref 0.7–3.1)
MCH: 30.6 pg (ref 26.6–33.0)
MCHC: 32.6 g/dL (ref 31.5–35.7)
MCV: 94 fL (ref 79–97)
MONOCYTES: 8 %
Monocytes Absolute: 0.4 10*3/uL (ref 0.1–0.9)
NEUTROS PCT: 55 %
Neutrophils Absolute: 2.7 10*3/uL (ref 1.4–7.0)
PLATELETS: 179 10*3/uL (ref 150–379)
RBC: 4.57 x10E6/uL (ref 4.14–5.80)
RDW: 13.5 % (ref 12.3–15.4)
WBC: 5 10*3/uL (ref 3.4–10.8)

## 2017-06-13 ENCOUNTER — Ambulatory Visit (HOSPITAL_COMMUNITY)
Admission: RE | Admit: 2017-06-13 | Discharge: 2017-06-13 | Disposition: A | Discharge status: Home / Self Care | Payer: PPO | Source: Ambulatory Visit | Attending: Cardiovascular Disease | Admitting: Cardiovascular Disease

## 2017-06-13 ENCOUNTER — Encounter (HOSPITAL_COMMUNITY): Payer: Self-pay | Admitting: *Deleted

## 2017-06-13 ENCOUNTER — Ambulatory Visit (HOSPITAL_COMMUNITY): Payer: PPO | Admitting: Certified Registered Nurse Anesthetist

## 2017-06-13 ENCOUNTER — Encounter (HOSPITAL_COMMUNITY)
Admission: RE | Disposition: A | Discharge status: Home / Self Care | Payer: Self-pay | Source: Ambulatory Visit | Attending: Cardiovascular Disease

## 2017-06-13 DIAGNOSIS — Z79899 Other long term (current) drug therapy: Secondary | ICD-10-CM | POA: Insufficient documentation

## 2017-06-13 DIAGNOSIS — I481 Persistent atrial fibrillation: Secondary | ICD-10-CM | POA: Insufficient documentation

## 2017-06-13 DIAGNOSIS — Z8546 Personal history of malignant neoplasm of prostate: Secondary | ICD-10-CM | POA: Diagnosis not present

## 2017-06-13 DIAGNOSIS — I4891 Unspecified atrial fibrillation: Secondary | ICD-10-CM | POA: Diagnosis not present

## 2017-06-13 DIAGNOSIS — N400 Enlarged prostate without lower urinary tract symptoms: Secondary | ICD-10-CM | POA: Insufficient documentation

## 2017-06-13 DIAGNOSIS — E785 Hyperlipidemia, unspecified: Secondary | ICD-10-CM | POA: Diagnosis not present

## 2017-06-13 DIAGNOSIS — Z7901 Long term (current) use of anticoagulants: Secondary | ICD-10-CM | POA: Insufficient documentation

## 2017-06-13 DIAGNOSIS — Z9889 Other specified postprocedural states: Secondary | ICD-10-CM | POA: Insufficient documentation

## 2017-06-13 DIAGNOSIS — I4819 Other persistent atrial fibrillation: Secondary | ICD-10-CM

## 2017-06-13 HISTORY — PX: CARDIOVERSION: SHX1299

## 2017-06-13 SURGERY — CARDIOVERSION
Anesthesia: General

## 2017-06-13 MED ORDER — LIDOCAINE HCL (CARDIAC) PF 100 MG/5ML IV SOSY
PREFILLED_SYRINGE | INTRAVENOUS | Status: DC | PRN
  Administered 2017-06-13: 100 mg via INTRATRACHEAL

## 2017-06-13 MED ORDER — SODIUM CHLORIDE 0.9 % IV SOLN
INTRAVENOUS | Status: DC | PRN
  Administered 2017-06-13: 12:00:00 via INTRAVENOUS

## 2017-06-13 MED ORDER — APIXABAN 5 MG PO TABS
5.0000 mg | ORAL_TABLET | Freq: Once | ORAL | Status: AC
  Administered 2017-06-13: 5 mg via ORAL
  Filled 2017-06-13: qty 1

## 2017-06-13 MED ORDER — PROPOFOL 10 MG/ML IV BOLUS
INTRAVENOUS | Status: DC | PRN
  Administered 2017-06-13: 100 mg via INTRAVENOUS

## 2017-06-13 NOTE — Interval H&P Note (Signed)
History and Physical Interval Note:  06/13/2017 11:37 AM  [name], MD  has presented today for surgery, with the diagnosis of AFIB  The various methods of treatment have been discussed with the patient and family. After consideration of risks, benefits and other options for treatment, the patient has consented to  Procedure(s): CARDIOVERSION (N/A) as a surgical intervention .  The patient's history has been reviewed, patient examined, no change in status, stable for surgery.  I have reviewed the patient's chart and labs.  Questions were answered to the patient's satisfaction.     Lavonne Kinderman

## 2017-06-13 NOTE — Anesthesia Postprocedure Evaluation (Signed)
Anesthesia Post Note  Patient: Brady Border, MD  Procedure(s) Performed: CARDIOVERSION (N/A )     Patient location during evaluation: PACU Anesthesia Type: General Level of consciousness: awake and alert Pain management: pain level controlled Vital Signs Assessment: post-procedure vital signs reviewed and stable Respiratory status: spontaneous breathing, nonlabored ventilation, respiratory function stable and patient connected to nasal cannula oxygen Cardiovascular status: blood pressure returned to baseline and stable Postop Assessment: no apparent nausea or vomiting Anesthetic complications: no    Last Vitals:  Vitals:   06/13/17 1220 06/13/17 1230  BP: 107/62 (!) 110/52  Pulse: (!) 56 (!) 58  Resp: 18 17  Temp:    SpO2: 96% 99%    Last Pain:  Vitals:   06/13/17 1238  TempSrc:   PainSc: 0-No pain                 Blanka Rockholt DAVID

## 2017-06-13 NOTE — Op Note (Addendum)
Procedure: Electrical Cardioversion Indications:  Atrial Fibrillation  Procedure Details:  Consent: Risks of procedure as well as the alternatives and risks of each were explained to the (patient/caregiver).  Consent for procedure obtained.  Time Out: Verified patient identification, verified procedure, site/side was marked, verified correct patient position, special equipment/implants available, medications/allergies/relevent history reviewed, required imaging and test results available.  Performed  Patient placed on cardiac monitor, pulse oximetry, supplemental oxygen as necessary.  Sedation given: IV propofol 100 mg IV, Dr. Conrad  Pacer pads placed anterior and posterior chest.  Cardioverted 1 time(s).  Cardioversion with synchronized biphasic 120J shock.  Evaluation: Findings: Post procedure EKG shows: NSR Complications: None Patient did tolerate procedure well.  Time Spent Directly with the Patient:  30 minutes   Brady Martinez 06/13/2017, 12:06 PM

## 2017-06-13 NOTE — Anesthesia Preprocedure Evaluation (Signed)
Anesthesia Evaluation  Patient identified by MRN, date of birth, ID band Patient awake    Reviewed: Allergy & Precautions, NPO status , Patient's Chart, lab work & pertinent test results  Airway Mallampati: I  TM Distance: >3 FB Neck ROM: Full    Dental   Pulmonary    Pulmonary exam normal        Cardiovascular Normal cardiovascular exam+ dysrhythmias Atrial Fibrillation      Neuro/Psych    GI/Hepatic   Endo/Other    Renal/GU      Musculoskeletal   Abdominal   Peds  Hematology   Anesthesia Other Findings   Reproductive/Obstetrics                             Anesthesia Physical Anesthesia Plan  ASA: III  Anesthesia Plan: General   Post-op Pain Management:    Induction:   PONV Risk Score and Plan: 2  Airway Management Planned: Mask  Additional Equipment:   Intra-op Plan:   Post-operative Plan:   Informed Consent: I have reviewed the patients History and Physical, chart, labs and discussed the procedure including the risks, benefits and alternatives for the proposed anesthesia with the patient or authorized representative who has indicated his/her understanding and acceptance.     Plan Discussed with: CRNA and Surgeon  Anesthesia Plan Comments:         Anesthesia Quick Evaluation

## 2017-06-13 NOTE — Transfer of Care (Signed)
Immediate Anesthesia Transfer of Care Note  Patient: Brady Border, MD  Procedure(s) Performed: CARDIOVERSION (N/A )  Patient Location: PACU and Endoscopy Unit  Anesthesia Type:General  Level of Consciousness: patient cooperative and responds to stimulation  Airway & Oxygen Therapy: Patient Spontanous Breathing  Post-op Assessment: Report given to RN and Post -op Vital signs reviewed and stable  Post vital signs: Reviewed and stable  Last Vitals:  Vitals Value Taken Time  BP    Temp    Pulse    Resp    SpO2      Last Pain:  Vitals:   06/13/17 1130  TempSrc: Oral  PainSc: 0-No pain         Complications: No apparent anesthesia complications

## 2017-06-13 NOTE — Discharge Instructions (Signed)
Electrical Cardioversion, Care After °This sheet gives you information about how to care for yourself after your procedure. Your health care provider may also give you more specific instructions. If you have problems or questions, contact your health care provider. °What can I expect after the procedure? °After the procedure, it is common to have: °· Some redness on the skin where the shocks were given. ° °Follow these instructions at home: °· Do not drive for 24 hours if you were given a medicine to help you relax (sedative). °· Take over-the-counter and prescription medicines only as told by your health care provider. °· Ask your health care provider how to check your pulse. Check it often. °· Rest for 48 hours after the procedure or as told by your health care provider. °· Avoid or limit your caffeine use as told by your health care provider. °Contact a health care provider if: °· You feel like your heart is beating too quickly or your pulse is not regular. °· You have a serious muscle cramp that does not go away. °Get help right away if: °· You have discomfort in your chest. °· You are dizzy or you feel faint. °· You have trouble breathing or you are short of breath. °· Your speech is slurred. °· You have trouble moving an arm or leg on one side of your body. °· Your fingers or toes turn cold or blue. °This information is not intended to replace advice given to you by your health care provider. Make sure you discuss any questions you have with your health care provider. °Document Released: 11/19/2012 Document Revised: 09/02/2015 Document Reviewed: 08/05/2015 °Elsevier Interactive Patient Education © 2018 [employer] ° °

## 2017-06-14 ENCOUNTER — Encounter (HOSPITAL_COMMUNITY): Payer: Self-pay | Admitting: Cardiovascular Disease

## 2017-06-25 ENCOUNTER — Encounter: Payer: Self-pay | Admitting: Internal Medicine

## 2017-07-15 ENCOUNTER — Encounter: Payer: Self-pay | Admitting: Internal Medicine

## 2017-07-15 ENCOUNTER — Ambulatory Visit: Payer: PPO | Admitting: Internal Medicine

## 2017-07-15 ENCOUNTER — Encounter (INDEPENDENT_AMBULATORY_CARE_PROVIDER_SITE_OTHER): Payer: Self-pay

## 2017-07-15 VITALS — BP 112/68 | HR 58 | Ht 74.0 in | Wt 201.0 lb

## 2017-07-15 DIAGNOSIS — I481 Persistent atrial fibrillation: Secondary | ICD-10-CM | POA: Diagnosis not present

## 2017-07-15 DIAGNOSIS — I4819 Other persistent atrial fibrillation: Secondary | ICD-10-CM

## 2017-07-15 NOTE — Patient Instructions (Addendum)
Medication Instructions:  Your physician recommends that you continue on your current medications as directed. Please refer to the Current Medication list given to you today.  Labwork: None ordered.  Testing/Procedures: None ordered.  Follow-Up: Your physician wants you to follow-up in: 4 months with Roderic Palau at the Afib clinic.      Any Other Special Instructions Will Be Listed Below (If Applicable).  If you need a refill on your cardiac medications before your next appointment, please call your pharmacy.

## 2017-07-15 NOTE — Progress Notes (Signed)
PCP: Vernie Shanks, MD   Primary EP: Dr Leonia Reader, MD is a 68 y.o. male who presents today for routine electrophysiology followup.  Since last being seen in our clinic, the patient reports doing reasonably well.  He was cardioverted 06/13/17 but has already returned to afib.  He is mostly unaware and feels that he is in sinus today.  He continues to exercise without limitation.  Today, he denies symptoms of palpitations, chest pain, shortness of breath,  lower extremity edema, dizziness, presyncope, or syncope.  The patient is otherwise without complaint today.   Past Medical History:  Diagnosis Date  . BPH (benign prostatic hyperplasia)   . Hyperlipidemia   . Nocturia   . Persistent atrial fibrillation ([place])   . Prostate cancer ([place])   . Wears glasses    Past Surgical History:  Procedure Laterality Date  . CARDIOVERSION N/A 06/13/2017   Procedure: CARDIOVERSION;  Surgeon: Sanda Klein, MD;  Location: [facility];  Service: Cardiovascular;  Laterality: N/A;  . SEPTOPLASTY  1996  . TONSILLECTOMY  1955  . TRANSURETHRAL RESECTION OF PROSTATE N/A 09/15/2015   Procedure: TRANSURETHRAL RESECTION OF THE PROSTATE (TURP);  Surgeon: Carolan Clines, MD;  Location: [facility];  Service: Urology;  Laterality: N/A;    ROS- all systems are reviewed and negatives except as per HPI above  Current Outpatient Medications  Medication Sig Dispense Refill  . apixaban (ELIQUIS) 5 MG TABS tablet Take 1 tablet (5 mg total) by mouth 2 (two) times daily. 180 tablet 3  . Ascorbic Acid (VITAMIN C) 1000 MG tablet Take 1,000 mg by mouth daily.    . Calcium-Magnesium-Vitamin D (CALCIUM 500 PO) Take 1 tablet by mouth daily.    . Cholecalciferol (VITAMIN D3) 5000 units CAPS Take 5,000 Units by mouth daily.     . Omega-3 Fatty Acids (FISH OIL) 1200 MG CAPS Take 1,200 mg by mouth daily.     . pravastatin (PRAVACHOL) 20 MG tablet Take 20 mg by mouth at bedtime.     . Turmeric  500 MG CAPS Take 500 mg by mouth daily.     . vitamin E 400 UNIT capsule Take 400 Units by mouth daily.     No current facility-administered medications for this visit.     Physical Exam: Vitals:   07/15/17 0929  BP: 112/68  Pulse: (!) 58  Weight: 201 lb (91.2 kg)  Height: 6\' 2"  (1.88 m)    GEN- The patient is well appearing, alert and oriented x 3 today.   Head- normocephalic, atraumatic Eyes-  Sclera clear, conjunctiva pink Ears- hearing intact Oropharynx- clear Lungs- Clear to ausculation bilaterally, normal work of breathing Heart- bradycardic irregular rhythm, no murmurs, rubs or gallops, PMI not laterally displaced GI- soft, NT, ND, + BS Extremities- no clubbing, cyanosis, or edema  Wt Readings from Last 3 Encounters:  07/15/17 201 lb (91.2 kg)  06/13/17 201 lb 9.6 oz (91.4 kg)  06/10/17 209 lb (94.8 kg)    EKG tracing ordered today is personally reviewed and shows afib, V rate 58 bpm, baseline artifact noted, QTc 414 msec  Assessment and Plan:  1. Persistent afib Rate controlled and doing well We discussed AAD options today.  I think that his long term likelihood of remaining in sinus is reduced.  I have advised rate control as his best option.  I also offered Tikosyn as an option which he declines.  We discussed guidelines which suggest for persistent afib AAD  prior to ablation and also discussed Clarice Pole results which reveal that ablation is primarily targeted at symptomatic relief.  He is clear that he does not wish to take AAD at this time and prefers rate control. chads2vasc score is 1. We discussed at length pros and cons of anticoagulation.  We discussed that AHA/ACC guidelines would advise either no anticoagulation, ASA, or OAC as an option.  He says he has read AVERROES and would prefer to continue eliquis at this time.  Continue on eliquis  Follow-up in AF clinic in 4 months I am happy to see when needed  Thompson Grayer MD, [facility] 07/15/2017 9:49 AM

## 2017-08-19 ENCOUNTER — Telehealth: Payer: Self-pay | Admitting: Internal Medicine

## 2017-08-19 NOTE — Telephone Encounter (Signed)
Pharm  Please address eliquis 

## 2017-08-19 NOTE — Telephone Encounter (Signed)
° °  [facility] Pre-operative Risk Assessment    Request for surgical clearance:  1. What type of surgery is being performed? Dental Cleaning  2. When is this surgery scheduled? 7.11.19  3. What type of clearance is required (medical clearance vs. Pharmacy clearance to hold med vs. Both)? Pharmacy  4. Are there any medications that need to be held prior to surgery and how long?Eliquis ?  5. Practice name and name of physician performing surgery? Dr Dorann Lodge  6. What is your office phone number [phone]   7.   What is your office fax number Dr Nira Retort is calling himself and didn't know fax number.  8.   Anesthesia type (None, local, MAC, general) ? None   Brady Martinez 08/19/2017, 11:09 AM  _________________________________________________________________   (provider comments below)

## 2017-08-20 NOTE — Telephone Encounter (Signed)
See attached

## 2017-08-20 NOTE — Telephone Encounter (Signed)
Recommend continuing Eliquis for dental cleaning.

## 2017-09-02 ENCOUNTER — Telehealth: Payer: Self-pay | Admitting: Internal Medicine

## 2017-09-02 NOTE — Telephone Encounter (Signed)
Patient with diagnosis of Afib on Eliquis for anticoagulation.    Procedure: prostate biopsy Date of procedure: 09/06/17  CHADS2-VASc score of  1 (CHF, HTN, AGE, DM2, stroke/tia x 2, CAD, AGE, male)  CrCl 75ml/min  Per office protocol, patient can hold Eliquis for 1-2 days prior to procedure.

## 2017-09-02 NOTE — Telephone Encounter (Signed)
Chart reviewed.  Recent DCCV 06/2017 with early return of AFib.  Rate control strategy chosen. CHADS2-VASc=1 (age).  Patient opted to remain on anticoagulation.   Will route to CVRR first for recommendations regarding how long to hold Eliquis. Will then need PA/NP to call patient to assess for any clinical change since last seen. Richardson Dopp, PA-C    09/02/2017 3:54 PM

## 2017-09-02 NOTE — Telephone Encounter (Signed)
° °  [facility] Pre-operative Risk Assessment    Request for surgical clearance:  1. What type of surgery is being performed? Prostate  biopsy   2. When is this surgery scheduled? 7.26.2019   3. What type of clearance is required (medical clearance vs. Pharmacy clearance to hold med vs. Both)? Dr. Albina Billet up to Dr. Rayann Heman   4. Are there any medications that need to be held prior to surgery and how long?apixaban (ELIQUIS) 5 MG TABS tablet  5. Practice name and name of physician performing surgery? Dr. Alinda Money @ [facility]   6. What is your office phone number [phone]   7.   What is your office fax number [phone]  8.   Anesthesia type (None, local, MAC, general) ? Patient unsure    Marcine Matar 09/02/2017, 12:17 PM  _________________________________________________________________   (provider comments below)

## 2017-09-02 NOTE — Telephone Encounter (Signed)
Patient contacted today.  He is doing well without any chest pain, shortness of breath. I explained the recommendations regarding holding Eliquis.  He plans to take his last dose of Eliquis the AM of 7/24. I have faxed a letter providing clearance to Dr. Alinda Money. Callback staff: Please make sure letter received by Dr. Lynne Logan office. Richardson Dopp, PA-C    09/02/2017 5:16 PM

## 2017-09-02 NOTE — Telephone Encounter (Signed)
Message will be routed to Pre op Pool for evaluation by provider.

## 2017-09-03 NOTE — Telephone Encounter (Signed)
[facility] confirmed clearance letter has been received.

## 2017-09-06 DIAGNOSIS — C61 Malignant neoplasm of prostate: Secondary | ICD-10-CM | POA: Diagnosis not present

## 2017-11-01 DIAGNOSIS — E559 Vitamin D deficiency, unspecified: Secondary | ICD-10-CM | POA: Diagnosis not present

## 2017-11-01 DIAGNOSIS — C61 Malignant neoplasm of prostate: Secondary | ICD-10-CM | POA: Diagnosis not present

## 2017-11-01 DIAGNOSIS — Z8639 Personal history of other endocrine, nutritional and metabolic disease: Secondary | ICD-10-CM | POA: Diagnosis not present

## 2017-11-01 DIAGNOSIS — R7309 Other abnormal glucose: Secondary | ICD-10-CM | POA: Diagnosis not present

## 2017-11-01 DIAGNOSIS — E7800 Pure hypercholesterolemia, unspecified: Secondary | ICD-10-CM | POA: Diagnosis not present

## 2017-11-01 DIAGNOSIS — Z86010 Personal history of colonic polyps: Secondary | ICD-10-CM | POA: Diagnosis not present

## 2017-11-05 DIAGNOSIS — Z0000 Encounter for general adult medical examination without abnormal findings: Secondary | ICD-10-CM | POA: Diagnosis not present

## 2017-11-05 DIAGNOSIS — Z1389 Encounter for screening for other disorder: Secondary | ICD-10-CM | POA: Diagnosis not present

## 2017-11-18 DIAGNOSIS — Z23 Encounter for immunization: Secondary | ICD-10-CM | POA: Diagnosis not present

## 2017-11-18 DIAGNOSIS — C61 Malignant neoplasm of prostate: Secondary | ICD-10-CM | POA: Diagnosis not present

## 2017-11-18 DIAGNOSIS — Z86010 Personal history of colonic polyps: Secondary | ICD-10-CM | POA: Diagnosis not present

## 2017-11-18 DIAGNOSIS — E559 Vitamin D deficiency, unspecified: Secondary | ICD-10-CM | POA: Diagnosis not present

## 2017-11-18 DIAGNOSIS — I4819 Other persistent atrial fibrillation: Secondary | ICD-10-CM | POA: Diagnosis not present

## 2017-11-18 DIAGNOSIS — E7800 Pure hypercholesterolemia, unspecified: Secondary | ICD-10-CM | POA: Diagnosis not present

## 2017-11-18 DIAGNOSIS — Z8639 Personal history of other endocrine, nutritional and metabolic disease: Secondary | ICD-10-CM | POA: Diagnosis not present

## 2017-11-20 ENCOUNTER — Ambulatory Visit (HOSPITAL_COMMUNITY): Payer: PPO | Admitting: Nurse Practitioner

## 2017-11-21 ENCOUNTER — Encounter (HOSPITAL_COMMUNITY): Payer: Self-pay | Admitting: Nurse Practitioner

## 2017-11-21 ENCOUNTER — Ambulatory Visit (HOSPITAL_COMMUNITY)
Admission: RE | Admit: 2017-11-21 | Discharge: 2017-11-21 | Disposition: A | Discharge status: Home / Self Care | Payer: PPO | Source: Ambulatory Visit | Attending: Nurse Practitioner | Admitting: Nurse Practitioner

## 2017-11-21 VITALS — BP 112/58 | HR 74 | Ht 74.0 in | Wt 187.0 lb

## 2017-11-21 DIAGNOSIS — Z7901 Long term (current) use of anticoagulants: Secondary | ICD-10-CM | POA: Diagnosis not present

## 2017-11-21 DIAGNOSIS — E785 Hyperlipidemia, unspecified: Secondary | ICD-10-CM | POA: Insufficient documentation

## 2017-11-21 DIAGNOSIS — I4819 Other persistent atrial fibrillation: Secondary | ICD-10-CM

## 2017-11-21 DIAGNOSIS — Z8546 Personal history of malignant neoplasm of prostate: Secondary | ICD-10-CM | POA: Diagnosis not present

## 2017-11-21 DIAGNOSIS — Z79899 Other long term (current) drug therapy: Secondary | ICD-10-CM | POA: Insufficient documentation

## 2017-11-21 DIAGNOSIS — Z9889 Other specified postprocedural states: Secondary | ICD-10-CM | POA: Diagnosis not present

## 2017-11-21 NOTE — Progress Notes (Addendum)
Primary Care Physician: Vernie Shanks, MD Referring Physician: Dr. Leonia Reader, MD is a 67 y.o. male with a h/o persistent afib, that is in the afib clinic for f/u.When he last saw Dr. Rayann Heman, AAD's were offered but he was asymptomatic in  afib and chose  rate control.   In the afib clinic today, he is very clear that he is asymptomatic and does not want AAD's. He is not on rate control meds but is nicely rate controlled at 74 bpm. He goes to the gym several times a week and is able to do everything he wants without symptoms. No bleeding issues with eliquis 5 mg bid. He recently had labs in September with PCP with Creatinine at 1.0/BUN at 9, H/H at 13.9/41.8 and Plts 134( chronic for pt)  Today, he denies symptoms of palpitations, chest pain, shortness of breath, orthopnea, PND, lower extremity edema, dizziness, presyncope, syncope, or neurologic sequela. The patient is tolerating medications without difficulties and is otherwise without complaint today.   Past Medical History:  Diagnosis Date  . BPH (benign prostatic hyperplasia)   . Hyperlipidemia   . Nocturia   . Persistent atrial fibrillation   . Prostate cancer ([place])   . Wears glasses    Past Surgical History:  Procedure Laterality Date  . CARDIOVERSION N/A 06/13/2017   Procedure: CARDIOVERSION;  Surgeon: Sanda Klein, MD;  Location: [facility];  Service: Cardiovascular;  Laterality: N/A;  . SEPTOPLASTY  1996  . TONSILLECTOMY  1955  . TRANSURETHRAL RESECTION OF PROSTATE N/A 09/15/2015   Procedure: TRANSURETHRAL RESECTION OF THE PROSTATE (TURP);  Surgeon: Carolan Clines, MD;  Location: [facility];  Service: Urology;  Laterality: N/A;    Current Outpatient Medications  Medication Sig Dispense Refill  . apixaban (ELIQUIS) 5 MG TABS tablet Take 1 tablet (5 mg total) by mouth 2 (two) times daily. 180 tablet 3  . pravastatin (PRAVACHOL) 20 MG tablet Take 20 mg by mouth at bedtime.     .  Turmeric 500 MG CAPS Take 500 mg by mouth daily.      No current facility-administered medications for this encounter.     No Known Allergies  Social History   Socioeconomic History  . Marital status: Married    Spouse name: Not on file  . Number of children: Not on file  . Years of education: Not on file  . Highest education level: Not on file  Occupational History  . Not on file  Social Needs  . Financial resource strain: Not on file  . Food insecurity:    Worry: Not on file    Inability: Not on file  . Transportation needs:    Medical: Not on file    Non-medical: Not on file  Tobacco Use  . Smoking status: Never Smoker  . Smokeless tobacco: Never Used  Substance and Sexual Activity  . Alcohol use: No  . Drug use: No  . Sexual activity: Not on file  Lifestyle  . Physical activity:    Days per week: Not on file    Minutes per session: Not on file  . Stress: Not on file  Relationships  . Social connections:    Talks on phone: Not on file    Gets together: Not on file    Attends religious service: Not on file    Active member of club or organization: Not on file    Attends meetings of clubs or organizations: Not on file  Relationship status: Not on file  . Intimate partner violence:    Fear of current or ex partner: Not on file    Emotionally abused: Not on file    Physically abused: Not on file    Forced sexual activity: Not on file  Other Topics Concern  . Not on file  Social History Narrative   Retired Pharmacist, community.  Lives with spouse in [place].    No family history on file.  ROS- All systems are reviewed and negative except as per the HPI above  Physical Exam: Vitals:   11/21/17 1004  BP: (!) 112/58  Pulse: 74  Weight: 84.8 kg  Height: 6\' 2"  (1.88 m)   Wt Readings from Last 3 Encounters:  11/21/17 84.8 kg  07/15/17 91.2 kg  06/13/17 91.4 kg    Labs: Lab Results  Component Value Date   NA 141 06/10/2017   K 4.4 06/10/2017   CL 104  06/10/2017   CO2 22 06/10/2017   GLUCOSE 87 06/10/2017   BUN 27 06/10/2017   CREATININE 1.07 06/10/2017   CALCIUM 9.5 06/10/2017   No results found for: INR No results found for: CHOL, HDL, LDLCALC, TRIG   GEN- The patient is well appearing, alert and oriented x 3 today.   Head- normocephalic, atraumatic Eyes-  Sclera clear, conjunctiva pink Ears- hearing intact Oropharynx- clear Neck- supple, no JVP Lymph- no cervical lymphadenopathy Lungs- Clear to ausculation bilaterally, normal work of breathing Heart- irregular rate and rhythm, no murmurs, rubs or gallops, PMI not laterally displaced GI- soft, NT, ND, + BS Extremities- no clubbing, cyanosis, or edema MS- no significant deformity or atrophy Skin- no rash or lesion Psych- euthymic mood, full affect Neuro- strength and sensation are intact  EKG-afib at 74 bpm, qrs int 90 ms, qtc 450 ms    Assessment and Plan: 1. Persistent afib Pt is clear that he is not symptomatic and does not want any change at this time He is nicely rate controlled without med on board  2.Chadsvasc score of 1 Continue Eliquis 5 mg bid Labs reviewed and stable, on correct dose of eliquis  He will f/u with Dr. Rayann Heman or afib clinic as needed  Geroge Baseman. Keisa Blow, [facility] [address] [place], [place] [address] [phone]

## 2018-03-21 DIAGNOSIS — C61 Malignant neoplasm of prostate: Secondary | ICD-10-CM | POA: Diagnosis not present

## 2018-04-02 DIAGNOSIS — C61 Malignant neoplasm of prostate: Secondary | ICD-10-CM | POA: Diagnosis not present

## 2018-04-02 DIAGNOSIS — R351 Nocturia: Secondary | ICD-10-CM | POA: Diagnosis not present

## 2018-04-02 DIAGNOSIS — N401 Enlarged prostate with lower urinary tract symptoms: Secondary | ICD-10-CM | POA: Diagnosis not present

## 2018-06-25 ENCOUNTER — Other Ambulatory Visit: Payer: Self-pay | Admitting: Internal Medicine

## 2018-08-27 DIAGNOSIS — I4819 Other persistent atrial fibrillation: Secondary | ICD-10-CM | POA: Diagnosis not present

## 2018-08-27 DIAGNOSIS — Z8639 Personal history of other endocrine, nutritional and metabolic disease: Secondary | ICD-10-CM | POA: Diagnosis not present

## 2018-08-27 DIAGNOSIS — Z86010 Personal history of colonic polyps: Secondary | ICD-10-CM | POA: Diagnosis not present

## 2018-08-27 DIAGNOSIS — E559 Vitamin D deficiency, unspecified: Secondary | ICD-10-CM | POA: Diagnosis not present

## 2018-08-27 DIAGNOSIS — C61 Malignant neoplasm of prostate: Secondary | ICD-10-CM | POA: Diagnosis not present

## 2018-08-27 DIAGNOSIS — E7800 Pure hypercholesterolemia, unspecified: Secondary | ICD-10-CM | POA: Diagnosis not present

## 2018-09-01 DIAGNOSIS — E7800 Pure hypercholesterolemia, unspecified: Secondary | ICD-10-CM | POA: Diagnosis not present

## 2018-09-01 DIAGNOSIS — C61 Malignant neoplasm of prostate: Secondary | ICD-10-CM | POA: Diagnosis not present

## 2018-09-01 DIAGNOSIS — I4819 Other persistent atrial fibrillation: Secondary | ICD-10-CM | POA: Diagnosis not present

## 2018-09-01 DIAGNOSIS — Z8639 Personal history of other endocrine, nutritional and metabolic disease: Secondary | ICD-10-CM | POA: Diagnosis not present

## 2018-09-01 DIAGNOSIS — Z86010 Personal history of colonic polyps: Secondary | ICD-10-CM | POA: Diagnosis not present

## 2018-09-01 DIAGNOSIS — M545 Low back pain: Secondary | ICD-10-CM | POA: Diagnosis not present

## 2018-09-01 DIAGNOSIS — R319 Hematuria, unspecified: Secondary | ICD-10-CM | POA: Diagnosis not present

## 2018-09-01 DIAGNOSIS — E559 Vitamin D deficiency, unspecified: Secondary | ICD-10-CM | POA: Diagnosis not present

## 2018-09-26 ENCOUNTER — Other Ambulatory Visit: Payer: Self-pay | Admitting: Internal Medicine

## 2018-09-29 NOTE — Telephone Encounter (Signed)
Prescription refill request for Eliquis received.  Last office visit: Roderic Palau (11-21-2017) Scr: 0.87 (08-27-2018) Age: 68 y.o. Weight: 84.8 kg   Prescription refill sent.

## 2018-11-05 DIAGNOSIS — C61 Malignant neoplasm of prostate: Secondary | ICD-10-CM | POA: Diagnosis not present

## 2018-11-12 DIAGNOSIS — N3941 Urge incontinence: Secondary | ICD-10-CM | POA: Diagnosis not present

## 2018-11-12 DIAGNOSIS — N401 Enlarged prostate with lower urinary tract symptoms: Secondary | ICD-10-CM | POA: Diagnosis not present

## 2018-11-12 DIAGNOSIS — C61 Malignant neoplasm of prostate: Secondary | ICD-10-CM | POA: Diagnosis not present

## 2018-11-20 ENCOUNTER — Telehealth: Payer: Self-pay | Admitting: Internal Medicine

## 2018-11-20 NOTE — Telephone Encounter (Signed)
New Message  1. What dental office are you calling from? Dr. Joycelyn Schmid Zott  2. What is your office phone number? [phone]  3. What is your fax number? [phone]  4. What type of procedure is the patient having performed? A crown  5. What date is procedure scheduled or is the patient there now? 11/25/18 (if the patient is at the dentist's office question goes to their cardiologist if he/she is in the office.  If not, question should go to the DOD).   6. What is your question (ex. Antibiotics prior to procedure, holding medication-we need to know how long dentist wants pt to hold med)? Hold blood thinners

## 2018-11-20 NOTE — Telephone Encounter (Signed)
   Primary Cardiologist: Thompson Grayer, MD  Chart reviewed as part of pre-operative protocol coverage. Simple dental extractions are considered low risk procedures per guidelines and generally do not require any specific cardiac clearance. It is also generally accepted that for simple extractions, dental cleanings and crowns, there is no need to interrupt blood thinner therapy.   SBE prophylaxis is not required for the patient.  I will route this recommendation to the requesting party via Epic fax function and remove from pre-op pool.  Please call with questions.  Kathyrn Drown, NP 11/20/2018, 9:21 AM

## 2018-12-02 DIAGNOSIS — Z23 Encounter for immunization: Secondary | ICD-10-CM | POA: Diagnosis not present

## 2018-12-04 DIAGNOSIS — H524 Presbyopia: Secondary | ICD-10-CM | POA: Diagnosis not present

## 2018-12-04 DIAGNOSIS — H5213 Myopia, bilateral: Secondary | ICD-10-CM | POA: Diagnosis not present

## 2018-12-04 DIAGNOSIS — H52222 Regular astigmatism, left eye: Secondary | ICD-10-CM | POA: Diagnosis not present

## 2018-12-04 DIAGNOSIS — H5053 Vertical heterophoria: Secondary | ICD-10-CM | POA: Diagnosis not present

## 2019-01-26 DIAGNOSIS — Z8639 Personal history of other endocrine, nutritional and metabolic disease: Secondary | ICD-10-CM | POA: Diagnosis not present

## 2019-01-26 DIAGNOSIS — C61 Malignant neoplasm of prostate: Secondary | ICD-10-CM | POA: Diagnosis not present

## 2019-01-26 DIAGNOSIS — E559 Vitamin D deficiency, unspecified: Secondary | ICD-10-CM | POA: Diagnosis not present

## 2019-01-26 DIAGNOSIS — E7800 Pure hypercholesterolemia, unspecified: Secondary | ICD-10-CM | POA: Diagnosis not present

## 2019-01-26 DIAGNOSIS — I4819 Other persistent atrial fibrillation: Secondary | ICD-10-CM | POA: Diagnosis not present

## 2019-01-29 DIAGNOSIS — E7800 Pure hypercholesterolemia, unspecified: Secondary | ICD-10-CM | POA: Diagnosis not present

## 2019-01-29 DIAGNOSIS — C61 Malignant neoplasm of prostate: Secondary | ICD-10-CM | POA: Diagnosis not present

## 2019-01-29 DIAGNOSIS — Z0000 Encounter for general adult medical examination without abnormal findings: Secondary | ICD-10-CM | POA: Diagnosis not present

## 2019-01-29 DIAGNOSIS — E559 Vitamin D deficiency, unspecified: Secondary | ICD-10-CM | POA: Diagnosis not present

## 2019-01-29 DIAGNOSIS — I4819 Other persistent atrial fibrillation: Secondary | ICD-10-CM | POA: Diagnosis not present

## 2019-01-29 DIAGNOSIS — Z8639 Personal history of other endocrine, nutritional and metabolic disease: Secondary | ICD-10-CM | POA: Diagnosis not present

## 2019-02-02 DIAGNOSIS — M545 Low back pain: Secondary | ICD-10-CM | POA: Diagnosis not present

## 2019-02-09 DIAGNOSIS — M545 Low back pain: Secondary | ICD-10-CM | POA: Diagnosis not present

## 2019-02-16 DIAGNOSIS — M545 Low back pain: Secondary | ICD-10-CM | POA: Diagnosis not present

## 2019-02-24 DIAGNOSIS — M545 Low back pain: Secondary | ICD-10-CM | POA: Diagnosis not present

## 2019-03-04 DIAGNOSIS — M545 Low back pain: Secondary | ICD-10-CM | POA: Diagnosis not present

## 2019-03-10 DIAGNOSIS — M545 Low back pain: Secondary | ICD-10-CM | POA: Diagnosis not present

## 2019-03-17 DIAGNOSIS — M545 Low back pain: Secondary | ICD-10-CM | POA: Diagnosis not present

## 2019-03-24 DIAGNOSIS — M545 Low back pain: Secondary | ICD-10-CM | POA: Diagnosis not present

## 2019-03-31 DIAGNOSIS — M545 Low back pain: Secondary | ICD-10-CM | POA: Diagnosis not present

## 2019-04-04 ENCOUNTER — Other Ambulatory Visit: Payer: Self-pay | Admitting: Internal Medicine

## 2019-04-06 ENCOUNTER — Other Ambulatory Visit (HOSPITAL_COMMUNITY): Payer: Self-pay | Admitting: *Deleted

## 2019-04-06 MED ORDER — APIXABAN 5 MG PO TABS: 5.0000 mg | ORAL_TABLET | Freq: Two times a day (BID) | ORAL | 0 refills | Status: DC

## 2019-04-06 NOTE — Telephone Encounter (Signed)
Messaged Stacy from Afib clinic who works with Roderic Palau and made her aware that pt is overdue for follow up. Called pt back and made him aware that for further future refills pt should contact PCP since he will be seeing PCP on a regular bases and coming to the cardiologist as needed.

## 2019-04-06 NOTE — Telephone Encounter (Signed)
Called pt and spoke to him. Made him aware that he is overdue for an office visit. Pt stated that he has not had a need to see the cardiologist or np at this time.  Pt stated that he is only suppose to follow up with cardiologist as needed. Per Kerby Less note from 11/21/2017, it dose say to follow up as needed. Spoke with pharm D. Will send in refill.

## 2019-04-06 NOTE — Telephone Encounter (Signed)
Prescription refill request for Eliquis received.   Last office visit: 10/10/2019Kayleen Memos Scr: 0.91, 01/26/2019 via [place] Age: 69 y.o. Weight: 84.8 kg  Overdue for office visit.

## 2019-04-07 DIAGNOSIS — M545 Low back pain: Secondary | ICD-10-CM | POA: Diagnosis not present

## 2019-05-20 DIAGNOSIS — C61 Malignant neoplasm of prostate: Secondary | ICD-10-CM | POA: Diagnosis not present

## 2019-05-27 DIAGNOSIS — C61 Malignant neoplasm of prostate: Secondary | ICD-10-CM | POA: Diagnosis not present

## 2019-05-27 DIAGNOSIS — N3941 Urge incontinence: Secondary | ICD-10-CM | POA: Diagnosis not present

## 2019-07-29 DIAGNOSIS — Z8639 Personal history of other endocrine, nutritional and metabolic disease: Secondary | ICD-10-CM | POA: Diagnosis not present

## 2019-07-29 DIAGNOSIS — E559 Vitamin D deficiency, unspecified: Secondary | ICD-10-CM | POA: Diagnosis not present

## 2019-07-29 DIAGNOSIS — C61 Malignant neoplasm of prostate: Secondary | ICD-10-CM | POA: Diagnosis not present

## 2019-07-29 DIAGNOSIS — I4819 Other persistent atrial fibrillation: Secondary | ICD-10-CM | POA: Diagnosis not present

## 2019-07-29 DIAGNOSIS — E7800 Pure hypercholesterolemia, unspecified: Secondary | ICD-10-CM | POA: Diagnosis not present

## 2019-07-30 DIAGNOSIS — I4819 Other persistent atrial fibrillation: Secondary | ICD-10-CM | POA: Diagnosis not present

## 2019-07-30 DIAGNOSIS — Z6826 Body mass index (BMI) 26.0-26.9, adult: Secondary | ICD-10-CM | POA: Diagnosis not present

## 2019-07-30 DIAGNOSIS — E569 Vitamin deficiency, unspecified: Secondary | ICD-10-CM | POA: Diagnosis not present

## 2019-07-30 DIAGNOSIS — E7800 Pure hypercholesterolemia, unspecified: Secondary | ICD-10-CM | POA: Diagnosis not present

## 2019-07-30 DIAGNOSIS — C61 Malignant neoplasm of prostate: Secondary | ICD-10-CM | POA: Diagnosis not present

## 2019-07-30 DIAGNOSIS — Z8639 Personal history of other endocrine, nutritional and metabolic disease: Secondary | ICD-10-CM | POA: Diagnosis not present

## 2019-07-30 DIAGNOSIS — D6869 Other thrombophilia: Secondary | ICD-10-CM | POA: Diagnosis not present

## 2019-08-18 IMAGING — MR MR PROSTATE WO/W CM
56 series · 56 of 56 positions shown · IV contrast (Multihance 19ml)
Comparison: Biopsy report from 09/14/2015. This demonstrates
Gleason 3+3=6 disease involving 5% of left mid lateral biopsy.

CLINICAL DATA: Prostate cancer, biopsy [DATE]. Prior TURP. PSA of
1.4.

EXAM:
MR PROSTATE WITHOUT AND WITH CONTRAST
TECHNIQUE: Multiplanar multisequence MRI images were obtained of the pelvis
centered about the prostate. Pre and post contrast images were
obtained.
CONTRAST:  19mL MULTIHANCE GADOBENATE DIMEGLUMINE 529 MG/ML IV SOLN
Creatinine was obtained on site at [HOSPITAL] at [HOSPITAL].
Results: Creatinine 1.4 mg/dL.

[Series 3: T1 · axial · 8.0mm · 1.06mm/px · 1 of 31 slices shown (1 of 2)]
[im 1/31]
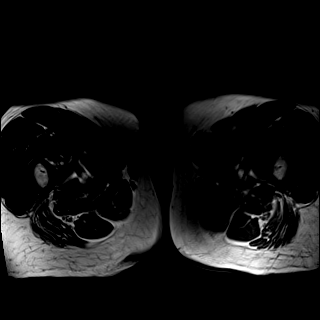

[Series 4: bSSFP fat-sat · axial · 8.0mm · 0.74mm/px · 1 of 31 slices shown]
[im 1/31]
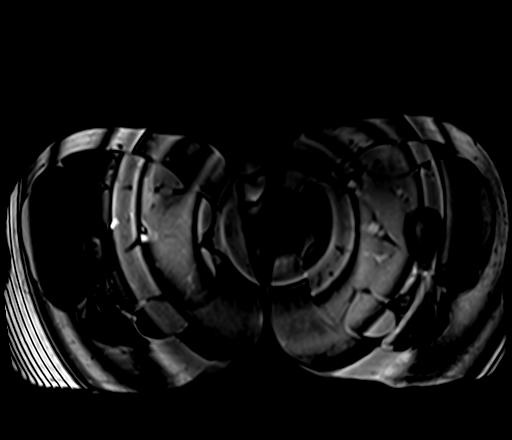

[Series 5: T2 · sagittal · 3.5mm · 0.56mm/px · 1 of 39 slices shown (1 of 4)]
[im 1/39]
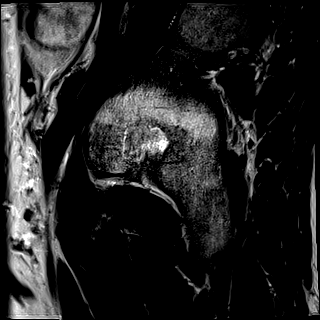

[Series 6: T1 · axial · 3.0mm · 0.31mm/px · 1 of 24 slices shown (2 of 2)]
[im 1/24]
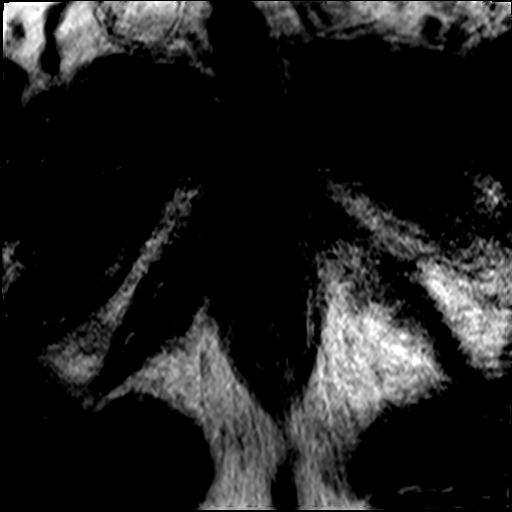

[Series 7: T2 · axial · 3.5mm · 0.56mm/px · 1 of 23 slices shown (2 of 4)]
[im 1/23]
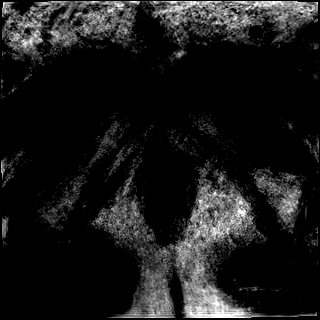

[Series 8: T2 · coronal · 3.5mm · 0.56mm/px · 1 of 23 slices shown (3 of 4)]
[im 1/23]
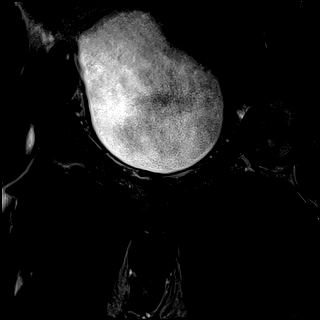

[Series 9: T2 · axial · 1.0mm · 1.04mm/px · 1 of 80 slices shown (4 of 4)]
[im 1/80]
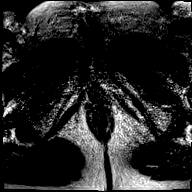

[Series 10: DWI · axial · 3.5mm · 1.56mm/px · 1 of 71 slices shown (1 of 2)]
[im 1/71]
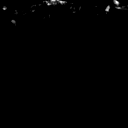

[Series 11: DWI · axial · 3.5mm · 1.56mm/px · 1 of 24 slices shown (2 of 2)]
[im 1/24]
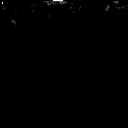

[Series 13: pre t1_twist_tra_dyn_ttc=6.4s · axial · non-contrast · 3.5mm · 0.83mm/px · 1 of 24 slices shown]
[im 1/24]
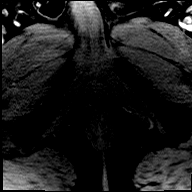

[Series 14: post t1_twist_tra_dyn-copy center · axial · 3.5mm · 0.83mm/px · 1 of 24 slices shown (1 of 24)]
[im 1/24]
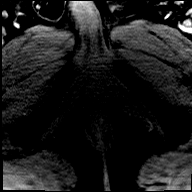

[Series 15: post t1_twist_tra_dyn-copy center · axial · 3.5mm · 0.83mm/px · 1 of 24 slices shown (2 of 24)]
[im 1/24]
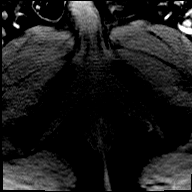

[Series 16: post t1_twist_tra_dyn-copy cent_sub_ttc=28.4s · axial · 3.5mm · 0.83mm/px · 1 of 24 slices shown]
[im 1/24]
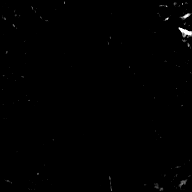

[Series 17: post t1_twist_tra_dyn-copy center · axial · 3.5mm · 0.83mm/px · 1 of 24 slices shown (3 of 24)]
[im 1/24]
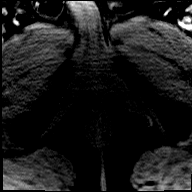

[Series 18: post t1_twist_tra_dyn-copy cent_sub_ttc=41.2s · axial · 3.5mm · 0.83mm/px · 1 of 24 slices shown]
[im 1/24]
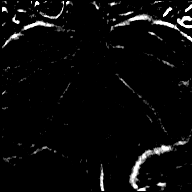

[Series 19: post t1_twist_tra_dyn-copy center · axial · 3.5mm · 0.83mm/px · 1 of 24 slices shown (4 of 24)]
[im 1/24]
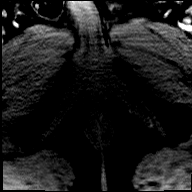

[Series 20: post t1_twist_tra_dyn-copy cent_sub_ttc=54.0s · axial · 3.5mm · 0.83mm/px · 1 of 24 slices shown]
[im 1/24]
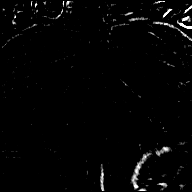

[Series 21: post t1_twist_tra_dyn-copy center · axial · 3.5mm · 0.83mm/px · 1 of 24 slices shown (5 of 24)]
[im 1/24]
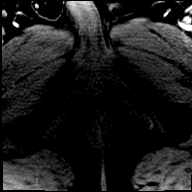

[Series 22: post t1_twist_tra_dyn-copy cent_sub_ttc=66.8s · axial · 3.5mm · 0.83mm/px · 1 of 24 slices shown]
[im 1/24]
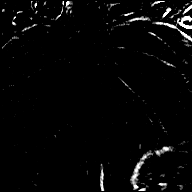

[Series 23: post t1_twist_tra_dyn-copy center · axial · 3.5mm · 0.83mm/px · 1 of 24 slices shown (6 of 24)]
[im 1/24]
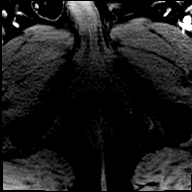

[Series 24: post t1_twist_tra_dyn-copy cent_sub_ttc=79.6s · axial · 3.5mm · 0.83mm/px · 1 of 24 slices shown]
[im 1/24]
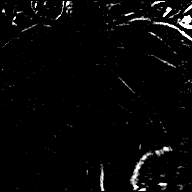

[Series 25: post t1_twist_tra_dyn-copy center · axial · 3.5mm · 0.83mm/px · 1 of 24 slices shown (7 of 24)]
[im 1/24]
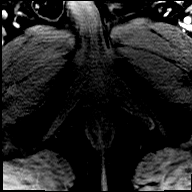

[Series 26: post t1_twist_tra_dyn-copy cent_sub_ttc=92.4s · axial · 3.5mm · 0.83mm/px · 1 of 24 slices shown]
[im 1/24]
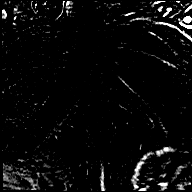

[Series 27: post t1_twist_tra_dyn-copy center · axial · 3.5mm · 0.83mm/px · 1 of 24 slices shown (8 of 24)]
[im 1/24]
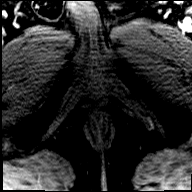

[Series 28: post t1_twist_tra_dyn-copy cent_sub_ttc=105.2s · axial · 3.5mm · 0.83mm/px · 1 of 24 slices shown]
[im 1/24]
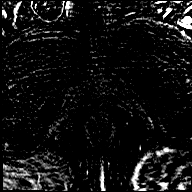

[Series 29: post t1_twist_tra_dyn-copy center · axial · 3.5mm · 0.83mm/px · 1 of 24 slices shown (9 of 24)]
[im 1/24]
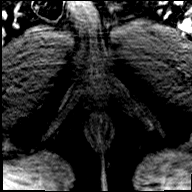

[Series 30: post t1_twist_tra_dyn-copy cent_sub_ttc=117.9s · axial · 3.5mm · 0.83mm/px · 1 of 24 slices shown]
[im 1/24]
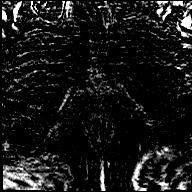

[Series 31: post t1_twist_tra_dyn-copy center · axial · 3.5mm · 0.83mm/px · 1 of 24 slices shown (10 of 24)]
[im 1/24]
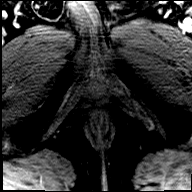

[Series 32: post t1_twist_tra_dyn-copy cent_sub_ttc=130.7s · axial · 3.5mm · 0.83mm/px · 1 of 24 slices shown]
[im 1/24]
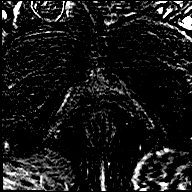

[Series 33: post t1_twist_tra_dyn-copy center · axial · 3.5mm · 0.83mm/px · 1 of 24 slices shown (11 of 24)]
[im 1/24]
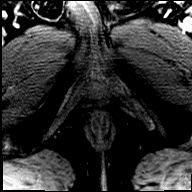

[Series 34: post t1_twist_tra_dyn-copy cent_sub_ttc=143.5s · axial · 3.5mm · 0.83mm/px · 1 of 24 slices shown]
[im 1/24]
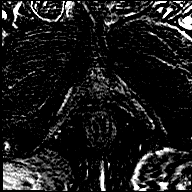

[Series 35: post t1_twist_tra_dyn-copy center · axial · 3.5mm · 0.83mm/px · 1 of 24 slices shown (12 of 24)]
[im 1/24]
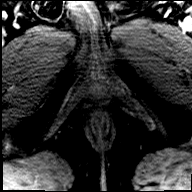

[Series 36: post t1_twist_tra_dyn-copy cent_sub_ttc=156.3s · axial · 3.5mm · 0.83mm/px · 1 of 24 slices shown]
[im 1/24]
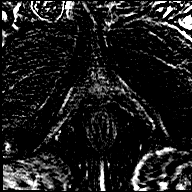

[Series 37: post t1_twist_tra_dyn-copy center · axial · 3.5mm · 0.83mm/px · 1 of 24 slices shown (13 of 24)]
[im 1/24]
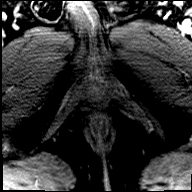

[Series 38: post t1_twist_tra_dyn-copy cent_sub_ttc=169.1s · axial · 3.5mm · 0.83mm/px · 1 of 24 slices shown]
[im 1/24]
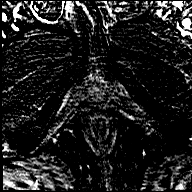

[Series 39: post t1_twist_tra_dyn-copy center · axial · 3.5mm · 0.83mm/px · 1 of 24 slices shown (14 of 24)]
[im 1/24]
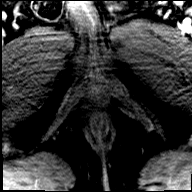

[Series 40: post t1_twist_tra_dyn-copy cent_sub_ttc=181.9s · axial · 3.5mm · 0.83mm/px · 1 of 24 slices shown]
[im 1/24]
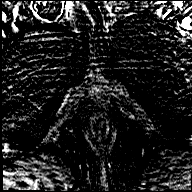

[Series 41: post t1_twist_tra_dyn-copy center · axial · 3.5mm · 0.83mm/px · 1 of 24 slices shown (15 of 24)]
[im 1/24]
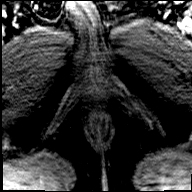

[Series 42: post t1_twist_tra_dyn-copy cent_sub_ttc=194.7s · axial · 3.5mm · 0.83mm/px · 1 of 24 slices shown]
[im 1/24]
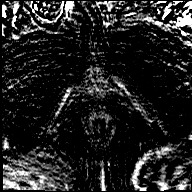

[Series 43: post t1_twist_tra_dyn-copy center · axial · 3.5mm · 0.83mm/px · 1 of 24 slices shown (16 of 24)]
[im 1/24]
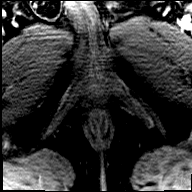

[Series 44: post t1_twist_tra_dyn-copy cent_sub_ttc=207.5s · axial · 3.5mm · 0.83mm/px · 1 of 24 slices shown]
[im 1/24]
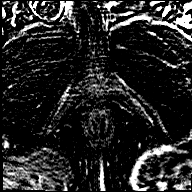

[Series 45: post t1_twist_tra_dyn-copy center · axial · 3.5mm · 0.83mm/px · 1 of 24 slices shown (17 of 24)]
[im 1/24]
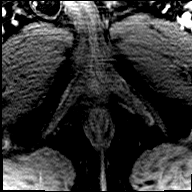

[Series 46: post t1_twist_tra_dyn-copy cent_sub_ttc=220.3s · axial · 3.5mm · 0.83mm/px · 1 of 24 slices shown]
[im 1/24]
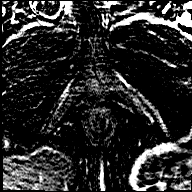

[Series 47: post t1_twist_tra_dyn-copy center · axial · 3.5mm · 0.83mm/px · 1 of 24 slices shown (18 of 24)]
[im 1/24]
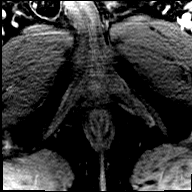

[Series 48: post t1_twist_tra_dyn-copy cent_sub_ttc=233.1s · axial · 3.5mm · 0.83mm/px · 1 of 24 slices shown]
[im 1/24]
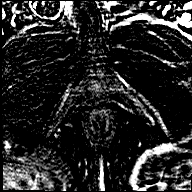

[Series 49: post t1_twist_tra_dyn-copy center · axial · 3.5mm · 0.83mm/px · 1 of 24 slices shown (19 of 24)]
[im 1/24]
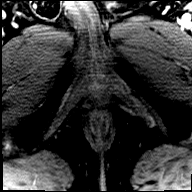

[Series 50: post t1_twist_tra_dyn-copy cent_sub_ttc=245.9s · axial · 3.5mm · 0.83mm/px · 1 of 24 slices shown]
[im 1/24]
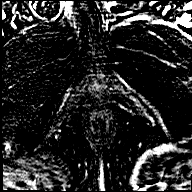

[Series 51: post t1_twist_tra_dyn-copy center · axial · 3.5mm · 0.83mm/px · 1 of 24 slices shown (20 of 24)]
[im 1/24]
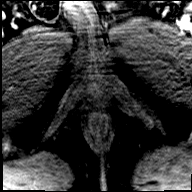

[Series 52: post t1_twist_tra_dyn-copy cent_sub_ttc=258.6s · axial · 3.5mm · 0.83mm/px · 1 of 24 slices shown]
[im 1/24]
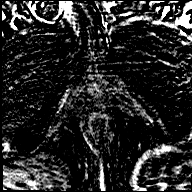

[Series 53: post t1_twist_tra_dyn-copy center · axial · 3.5mm · 0.83mm/px · 1 of 24 slices shown (21 of 24)]
[im 1/24]
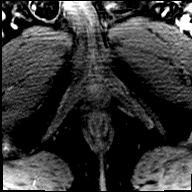

[Series 54: post t1_twist_tra_dyn-copy cent_sub_ttc=271.4s · axial · 3.5mm · 0.83mm/px · 1 of 24 slices shown]
[im 1/24]
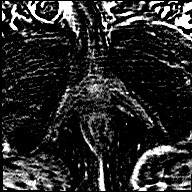

[Series 55: post t1_twist_tra_dyn-copy center · axial · 3.5mm · 0.83mm/px · 1 of 24 slices shown (22 of 24)]
[im 1/24]
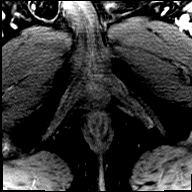

[Series 56: post t1_twist_tra_dyn-copy cent_sub_ttc=284.2s · axial · 3.5mm · 0.83mm/px · 1 of 24 slices shown]
[im 1/24]
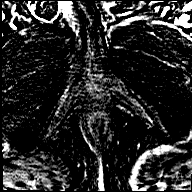

[Series 57: post t1_twist_tra_dyn-copy center · axial · 3.5mm · 0.83mm/px · 1 of 24 slices shown (23 of 24)]
[im 1/24]
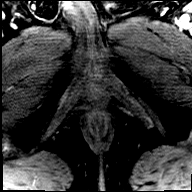

[Series 58: post t1_twist_tra_dyn-copy cent_sub_ttc=297.0s · axial · 3.5mm · 0.83mm/px · 1 of 24 slices shown]
[im 1/24]
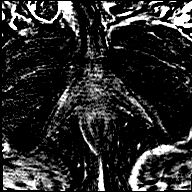

[Series 59: post t1_twist_tra_dyn-copy center · axial · 3.5mm · 0.83mm/px · 1 of 24 slices shown (24 of 24)]
[im 1/24]
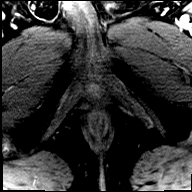

[56 of 56 positions shown; findings below may reference images not displayed]

FINDINGS: Moderate motion degradation throughout. Unfortunately, the
high-resolution T2 images, especially the axials, are severely
motion degraded and relatively nondiagnostic.

Prostate: Moderate central gland enlargement and heterogeneity,
consistent with benign prostatic hyperplasia. TURP defect. No
dominant peripheral zone lesion identified. No convincing evidence
of restricted diffusion or early post-contrast enhancement.

Volume: 4.8 x 3.4 x 4.5 cm (volume = 38 cm^3).

Transcapsular spread:  Limited evaluation for.

Seminal vesicle involvement: No gross seminal vesicle abnormality
identified.

Neurovascular bundle involvement: Not well evaluated.

Pelvic adenopathy: Absent

Bone metastasis: Absent

Other findings: No significant free fluid. Normal urinary bladder.
Tiny right inguinal hernia contains fat.
IMPRESSION: 1. Moderately motion degraded exam, especially the high-resolution
T2 weighted images.
2. Given this limitation, no gross prostatic lesion identified.
3. No evidence of pelvic metastasis.

## 2019-10-05 DIAGNOSIS — Z20822 Contact with and (suspected) exposure to covid-19: Secondary | ICD-10-CM | POA: Diagnosis not present

## 2019-11-05 DIAGNOSIS — Z630 Problems in relationship with spouse or partner: Secondary | ICD-10-CM | POA: Diagnosis not present

## 2019-11-05 DIAGNOSIS — M545 Low back pain: Secondary | ICD-10-CM | POA: Diagnosis not present

## 2019-11-05 DIAGNOSIS — G479 Sleep disorder, unspecified: Secondary | ICD-10-CM | POA: Diagnosis not present

## 2019-11-05 DIAGNOSIS — R413 Other amnesia: Secondary | ICD-10-CM | POA: Diagnosis not present

## 2019-11-09 DIAGNOSIS — E538 Deficiency of other specified B group vitamins: Secondary | ICD-10-CM | POA: Diagnosis not present

## 2019-11-16 DIAGNOSIS — E538 Deficiency of other specified B group vitamins: Secondary | ICD-10-CM | POA: Diagnosis not present

## 2019-11-23 DIAGNOSIS — D510 Vitamin B12 deficiency anemia due to intrinsic factor deficiency: Secondary | ICD-10-CM | POA: Diagnosis not present

## 2019-11-30 DIAGNOSIS — D510 Vitamin B12 deficiency anemia due to intrinsic factor deficiency: Secondary | ICD-10-CM | POA: Diagnosis not present

## 2019-12-04 DIAGNOSIS — E538 Deficiency of other specified B group vitamins: Secondary | ICD-10-CM | POA: Diagnosis not present

## 2019-12-16 DIAGNOSIS — C61 Malignant neoplasm of prostate: Secondary | ICD-10-CM | POA: Diagnosis not present

## 2019-12-23 DIAGNOSIS — R3915 Urgency of urination: Secondary | ICD-10-CM | POA: Diagnosis not present

## 2019-12-23 DIAGNOSIS — C61 Malignant neoplasm of prostate: Secondary | ICD-10-CM | POA: Diagnosis not present

## 2019-12-29 DIAGNOSIS — E538 Deficiency of other specified B group vitamins: Secondary | ICD-10-CM | POA: Diagnosis not present

## 2020-01-18 ENCOUNTER — Ambulatory Visit: Payer: PPO | Admitting: Neurology

## 2020-01-29 DIAGNOSIS — M5459 Other low back pain: Secondary | ICD-10-CM | POA: Diagnosis not present

## 2020-01-29 DIAGNOSIS — Z630 Problems in relationship with spouse or partner: Secondary | ICD-10-CM | POA: Diagnosis not present

## 2020-01-29 DIAGNOSIS — G479 Sleep disorder, unspecified: Secondary | ICD-10-CM | POA: Diagnosis not present

## 2020-01-29 DIAGNOSIS — D6869 Other thrombophilia: Secondary | ICD-10-CM | POA: Diagnosis not present

## 2020-01-29 DIAGNOSIS — E538 Deficiency of other specified B group vitamins: Secondary | ICD-10-CM | POA: Diagnosis not present

## 2020-01-29 DIAGNOSIS — Z8639 Personal history of other endocrine, nutritional and metabolic disease: Secondary | ICD-10-CM | POA: Diagnosis not present

## 2020-01-29 DIAGNOSIS — E559 Vitamin D deficiency, unspecified: Secondary | ICD-10-CM | POA: Diagnosis not present

## 2020-01-29 DIAGNOSIS — C61 Malignant neoplasm of prostate: Secondary | ICD-10-CM | POA: Diagnosis not present

## 2020-01-29 DIAGNOSIS — R413 Other amnesia: Secondary | ICD-10-CM | POA: Diagnosis not present

## 2020-01-29 DIAGNOSIS — Z6826 Body mass index (BMI) 26.0-26.9, adult: Secondary | ICD-10-CM | POA: Diagnosis not present

## 2020-01-29 DIAGNOSIS — E7800 Pure hypercholesterolemia, unspecified: Secondary | ICD-10-CM | POA: Diagnosis not present

## 2020-01-29 DIAGNOSIS — I4819 Other persistent atrial fibrillation: Secondary | ICD-10-CM | POA: Diagnosis not present

## 2020-02-16 ENCOUNTER — Telehealth: Payer: Self-pay | Admitting: Neurology

## 2020-02-16 ENCOUNTER — Ambulatory Visit: Payer: PPO | Admitting: Neurology

## 2020-02-16 ENCOUNTER — Encounter: Payer: Self-pay | Admitting: Neurology

## 2020-02-16 VITALS — BP 119/71 | HR 81 | Ht 75.0 in | Wt 221.4 lb

## 2020-02-16 DIAGNOSIS — E538 Deficiency of other specified B group vitamins: Secondary | ICD-10-CM

## 2020-02-16 DIAGNOSIS — R413 Other amnesia: Secondary | ICD-10-CM

## 2020-02-16 HISTORY — DX: Deficiency of other specified B group vitamins: E53.8

## 2020-02-16 MED ORDER — DONEPEZIL HCL 5 MG PO TABS: 5.0000 mg | ORAL_TABLET | Freq: Every day | ORAL | 0 refills | Status: DC

## 2020-02-16 NOTE — Telephone Encounter (Signed)
health team order sent to GI. No auth they will reach out to the patient to schedule.  

## 2020-02-16 NOTE — Patient Instructions (Signed)
We will start aricept for the memory.  Begin Aricept (donepezil) at 5 mg at night for one month. If this medication is well-tolerated, please call our office and we will call in a prescription for the 10 mg tablets. Look out for side effects that may include nausea, diarrhea, weight loss, or stomach cramps. This medication will also cause a runny nose, therefore there is no need for allergy medications for this purpose.  

## 2020-02-16 NOTE — Progress Notes (Signed)
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  The patient comes in with his wife who is adamant that the patient is having severe changes in his cognitive function over the last 1 year.  The patient himself does report some short-term memory changes, he has difficulty remembering names or places, but usually the names will come to him if he thinks about it a few minutes.  His wife believes that the memory changes have occurred over the last 1 year.  He is still able to function and do the finances and pay the taxes.  He is keeping up with medications and appointments.  The patient has had some issues with driving, he will have difficulty with directions which has always been a problem for him but have worsened significantly.  He may pull out in front of another vehicle or not see a stopped school bus.  The patient has some difficulty with using his computer.  The patient does have a paternal uncle who has Alzheimer's disease.  Otherwise, no significant family history is noted.  The patient denies any problems with insomnia or fatigue during the day.  He has a good energy level.  He was found to have a significantly low vitamin B12 level and is on shots for this.  The wife believes there has been some personality change.  He has not had any numbness or weakness of the extremities or any problems with balance or difficulty controlling the bowels or the bladder.  He denies headache or dizziness.  He comes to this office for further evaluation.  Past Medical History:  Diagnosis Date  . BPH (benign prostatic hyperplasia)   . Hyperlipidemia   . Nocturia   . Persistent atrial fibrillation (HCC)   . Prostate cancer (HCC)   . Wears glasses     Past Surgical  History:  Procedure Laterality Date  . CARDIOVERSION N/A 06/13/2017   Procedure: CARDIOVERSION;  Surgeon: Thurmon Fair, MD;  Location: [facility];  Service: Cardiovascular;  Laterality: N/A;  . SEPTOPLASTY  1996  . TONSILLECTOMY  1955  . TRANSURETHRAL RESECTION OF PROSTATE N/A 09/15/2015   Procedure: TRANSURETHRAL RESECTION OF THE PROSTATE (TURP);  Surgeon: Jethro Bolus, MD;  Location: [facility];  Service: Urology;  Laterality: N/A;    History reviewed. No pertinent family history.  Social history:  reports that he has never smoked. He has never used smokeless tobacco. He reports that he does not drink alcohol and does not use drugs.  Medications:  Prior to Admission medications   Medication Sig Start Date End Date Taking? Authorizing Provider  apixaban (ELIQUIS) 5 MG TABS tablet Take 1 tablet (5 mg total) by mouth 2 (two) times daily. Future refills per PCP office 04/06/19   Allred, Fayrene Fearing, MD  pravastatin (PRAVACHOL) 20 MG tablet Take 20 mg by mouth at bedtime.     [provider]  Turmeric 500 MG CAPS Take 500 mg by mouth daily.     [provider]     No Known Allergies  ROS:  Out of a complete 14 system review of symptoms, the patient complains only of the following symptoms, and all other reviewed systems are negative.  Memory troubles  Blood pressure 119/71, pulse 81, height 6\' 3"  (  1.905 m), weight 221 lb 6.4 oz (100.4 kg).  Physical Exam  General: The patient is alert and cooperative at the time of the examination.  Eyes: Pupils are equal, round, and reactive to light. Discs are flat bilaterally.  Neck: The neck is supple, no carotid bruits are noted.  Respiratory: The respiratory examination is clear.  Cardiovascular: The cardiovascular examination reveals a regular rate and rhythm, no obvious murmurs or rubs are noted.  Skin: Extremities are without significant edema on the right ankle, 1-2+ edema on the left.  Neurologic  Exam  Mental status: The patient is alert and oriented x 3 at the time of the examination. The patient has apparent normal recent and remote memory, with an apparently normal attention span and concentration ability.  The MoCA test today reveals a total score 24/30.  Cranial nerves: Facial symmetry is present. There is good sensation of the face to pinprick and soft touch bilaterally. The strength of the facial muscles and the muscles to head turning and shoulder shrug are normal bilaterally. Speech is well enunciated, no aphasia or dysarthria is noted. Extraocular movements are full. Visual fields are full. The tongue is midline, and the patient has symmetric elevation of the soft palate. No obvious hearing deficits are noted.  Motor: The motor testing reveals 5 over 5 strength of all 4 extremities. Good symmetric motor tone is noted throughout.  Sensory: Sensory testing is intact to pinprick, soft touch, vibration sensation, and position sense on all 4 extremities. No evidence of extinction is noted.  Coordination: Cerebellar testing reveals good finger-nose-finger and heel-to-shin bilaterally.  Gait and station: Gait is normal. Tandem gait is normal. Romberg is negative. No drift is seen.  Reflexes: Deep tendon reflexes are symmetric and normal bilaterally. Toes are downgoing bilaterally.   Assessment/Plan:  1.  Memory disturbance  The patient very well may be developing a memory disturbance over time.  His wife is somewhat emotionally intense in her reaction to some of her husband's memory issues.  The patient however is having a lot of problems with directions in particular, he is getting lost even while walking at times.  She claims that he may drive by his own house and not recognize that he is home.  The patient will be set up for MRI of the brain.  He will be placed on Aricept, he will follow-up here in 6 months.  Marlan Palau MD 02/16/2020 12:00 PM  [facility] [address] [place], [place] [address]  Phone [phone] Fax [phone]

## 2020-03-01 ENCOUNTER — Other Ambulatory Visit: Payer: Self-pay

## 2020-03-01 ENCOUNTER — Ambulatory Visit
Admission: RE | Admit: 2020-03-01 | Discharge: 2020-03-01 | Disposition: A | Discharge status: Home / Self Care | Payer: PPO | Source: Ambulatory Visit | Attending: Neurology | Admitting: Neurology

## 2020-03-01 DIAGNOSIS — R413 Other amnesia: Secondary | ICD-10-CM | POA: Diagnosis not present

## 2020-03-02 ENCOUNTER — Telehealth: Payer: Self-pay | Admitting: Neurology

## 2020-03-02 NOTE — Telephone Encounter (Signed)
I called the patient.  MRI of the brain shows diffuse atrophy that includes the parasylvian and mesial temporal area.  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  He is on low-dose Aricept.  There is what looks like a difference in size of the midbrain structures that likely is quite chronic in nature, we may consider a contrast study in the future.   MRI brain 03/02/20:  IMPRESSION:   MRI brain (without) demonstrating: - Mild-moderate perisylvian and mesial temporal atrophy. - Slight asymmetry of right midbrain, smaller than left side, as noted above.  Considerations include underlying congenital etiology, developmental venous anomaly or enlarged perivascular spaces.  Consider follow-up MRI with and without contrast as clinically indicated. - No acute findings.

## 2020-03-03 DIAGNOSIS — C61 Malignant neoplasm of prostate: Secondary | ICD-10-CM | POA: Diagnosis not present

## 2020-03-03 DIAGNOSIS — Z8639 Personal history of other endocrine, nutritional and metabolic disease: Secondary | ICD-10-CM | POA: Diagnosis not present

## 2020-03-03 DIAGNOSIS — E538 Deficiency of other specified B group vitamins: Secondary | ICD-10-CM | POA: Diagnosis not present

## 2020-03-03 DIAGNOSIS — Z630 Problems in relationship with spouse or partner: Secondary | ICD-10-CM | POA: Diagnosis not present

## 2020-03-03 DIAGNOSIS — E7800 Pure hypercholesterolemia, unspecified: Secondary | ICD-10-CM | POA: Diagnosis not present

## 2020-03-03 DIAGNOSIS — R413 Other amnesia: Secondary | ICD-10-CM | POA: Diagnosis not present

## 2020-03-03 DIAGNOSIS — E569 Vitamin deficiency, unspecified: Secondary | ICD-10-CM | POA: Diagnosis not present

## 2020-03-03 DIAGNOSIS — I4819 Other persistent atrial fibrillation: Secondary | ICD-10-CM | POA: Diagnosis not present

## 2020-03-03 DIAGNOSIS — Z6826 Body mass index (BMI) 26.0-26.9, adult: Secondary | ICD-10-CM | POA: Diagnosis not present

## 2020-03-03 DIAGNOSIS — D6869 Other thrombophilia: Secondary | ICD-10-CM | POA: Diagnosis not present

## 2020-03-03 DIAGNOSIS — G479 Sleep disorder, unspecified: Secondary | ICD-10-CM | POA: Diagnosis not present

## 2020-03-04 DIAGNOSIS — C61 Malignant neoplasm of prostate: Secondary | ICD-10-CM | POA: Diagnosis not present

## 2020-03-04 DIAGNOSIS — D075 Carcinoma in situ of prostate: Secondary | ICD-10-CM | POA: Diagnosis not present

## 2020-03-14 ENCOUNTER — Other Ambulatory Visit: Payer: Self-pay | Admitting: Neurology

## 2020-03-14 DIAGNOSIS — R413 Other amnesia: Secondary | ICD-10-CM

## 2020-03-15 ENCOUNTER — Other Ambulatory Visit: Payer: Self-pay | Admitting: *Deleted

## 2020-03-15 ENCOUNTER — Telehealth: Payer: Self-pay | Admitting: Neurology

## 2020-03-15 ENCOUNTER — Other Ambulatory Visit: Payer: Self-pay | Admitting: Emergency Medicine

## 2020-03-15 DIAGNOSIS — R413 Other amnesia: Secondary | ICD-10-CM

## 2020-03-15 MED ORDER — DONEPEZIL HCL 10 MG PO TABS: 10.0000 mg | ORAL_TABLET | Freq: Every day | ORAL | 5 refills | Status: DC

## 2020-03-15 MED ORDER — DONEPEZIL HCL 10 MG PO TABS: 10.0000 mg | ORAL_TABLET | Freq: Every day | ORAL | 1 refills | Status: DC

## 2020-03-15 NOTE — Telephone Encounter (Signed)
Pt called, picked up prescription donepezil (ARICEPT) 5 MG tablet. Prescription had not been increased to 10 mg and was only a 30-day supply. Asking for a 90-day supply. Would like a call from the nurse.

## 2020-03-15 NOTE — Telephone Encounter (Signed)
Called patient to inform him the 10mg  has been escribe and will be available to pick up today.

## 2020-03-15 NOTE — Telephone Encounter (Signed)
The prescription for the Aricept was sent today.

## 2020-03-17 ENCOUNTER — Encounter: Payer: Self-pay | Admitting: Medical Oncology

## 2020-03-17 NOTE — Progress Notes (Signed)
I called pt to introduce myself as the Prostate Nurse Navigator and the Coordinator of the Prostate MDC.  1. I confirmed with the patient he is aware of his referral to the clinic 2/11, arriving @ 8 am.  2. I discussed the format of the clinic and the physicians he will be seeing that day.  3. I discussed where the clinic is located and how to contact me.  4. I confirmed his address and informed him I would be mailing a packet of information and forms to be completed. I asked him to bring them with him the day of his appointment.   He voiced understanding of the above. I asked him to call me if he has any questions or concerns regarding his appointments or the forms he needs to complete. 

## 2020-03-18 ENCOUNTER — Encounter: Payer: Self-pay | Admitting: Medical Oncology

## 2020-03-24 ENCOUNTER — Encounter: Payer: Self-pay | Admitting: Radiation Oncology

## 2020-03-24 ENCOUNTER — Encounter: Payer: Self-pay | Admitting: Medical Oncology

## 2020-03-24 DIAGNOSIS — D510 Vitamin B12 deficiency anemia due to intrinsic factor deficiency: Secondary | ICD-10-CM | POA: Insufficient documentation

## 2020-03-24 DIAGNOSIS — Z8639 Personal history of other endocrine, nutritional and metabolic disease: Secondary | ICD-10-CM | POA: Insufficient documentation

## 2020-03-24 DIAGNOSIS — D6869 Other thrombophilia: Secondary | ICD-10-CM | POA: Insufficient documentation

## 2020-03-24 DIAGNOSIS — C61 Malignant neoplasm of prostate: Secondary | ICD-10-CM | POA: Insufficient documentation

## 2020-03-24 DIAGNOSIS — Z125 Encounter for screening for malignant neoplasm of prostate: Secondary | ICD-10-CM | POA: Insufficient documentation

## 2020-03-24 DIAGNOSIS — G479 Sleep disorder, unspecified: Secondary | ICD-10-CM | POA: Insufficient documentation

## 2020-03-24 DIAGNOSIS — R413 Other amnesia: Secondary | ICD-10-CM | POA: Insufficient documentation

## 2020-03-24 DIAGNOSIS — Z860100 Personal history of colon polyps, unspecified: Secondary | ICD-10-CM | POA: Insufficient documentation

## 2020-03-24 DIAGNOSIS — E559 Vitamin D deficiency, unspecified: Secondary | ICD-10-CM | POA: Insufficient documentation

## 2020-03-24 DIAGNOSIS — N401 Enlarged prostate with lower urinary tract symptoms: Secondary | ICD-10-CM | POA: Insufficient documentation

## 2020-03-24 DIAGNOSIS — M545 Low back pain, unspecified: Secondary | ICD-10-CM | POA: Insufficient documentation

## 2020-03-24 DIAGNOSIS — Z6826 Body mass index (BMI) 26.0-26.9, adult: Secondary | ICD-10-CM | POA: Insufficient documentation

## 2020-03-24 DIAGNOSIS — Z1211 Encounter for screening for malignant neoplasm of colon: Secondary | ICD-10-CM | POA: Insufficient documentation

## 2020-03-24 DIAGNOSIS — E7800 Pure hypercholesterolemia, unspecified: Secondary | ICD-10-CM | POA: Insufficient documentation

## 2020-03-24 NOTE — Progress Notes (Signed)
GU Location of Tumor / Histology: prostatic adenocarcinoma  If Prostate Cancer, Gleason Score is (3 + 4) and PSA is (1.86). Prostate volume: 52.3 grams.  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  Biopsies of prostate (if applicable) revealed:  Past/Anticipated interventions by urology, if any: prostate biopsy, active surveillance, prostate biopsy, active surveillance, prostate biopsy, referral to [facility].  Past/Anticipated interventions by medical oncology, if any: no  Weight changes, if any: denies  Bowel/Bladder complaints, if any: IPSS 7. SHIM 1.    Nausea/Vomiting, if any: denies  Pain issues, if any:  Back pain  SAFETY ISSUES:  Prior radiation? denies  Pacemaker/ICD? denies  Possible current pregnancy? no, male patient  Is the patient on methotrexate? denies  Current Complaints / other details:  70 year old male. Married to [place]. Retired ED physician.

## 2020-03-24 NOTE — Progress Notes (Signed)
Left voicemail reminder for [facility] appointment 2/11, arriving @ 8 am.

## 2020-03-25 ENCOUNTER — Encounter: Payer: Self-pay | Admitting: Medical Oncology

## 2020-03-25 ENCOUNTER — Inpatient Hospital Stay: Payer: PPO | Attending: Oncology | Admitting: Oncology

## 2020-03-25 ENCOUNTER — Other Ambulatory Visit: Payer: Self-pay

## 2020-03-25 ENCOUNTER — Encounter: Payer: Self-pay | Admitting: General Practice

## 2020-03-25 ENCOUNTER — Encounter: Payer: Self-pay | Admitting: Radiation Oncology

## 2020-03-25 ENCOUNTER — Ambulatory Visit
Admission: RE | Admit: 2020-03-25 | Discharge: 2020-03-25 | Disposition: A | Discharge status: Home / Self Care | Payer: PPO | Source: Ambulatory Visit | Attending: Radiation Oncology | Admitting: Radiation Oncology

## 2020-03-25 VITALS — BP 132/72 | HR 66 | Temp 96.9°F | Resp 18 | Ht 75.0 in | Wt 224.2 lb

## 2020-03-25 DIAGNOSIS — N3941 Urge incontinence: Secondary | ICD-10-CM | POA: Diagnosis not present

## 2020-03-25 DIAGNOSIS — C61 Malignant neoplasm of prostate: Secondary | ICD-10-CM | POA: Diagnosis not present

## 2020-03-25 DIAGNOSIS — E785 Hyperlipidemia, unspecified: Secondary | ICD-10-CM | POA: Diagnosis not present

## 2020-03-25 DIAGNOSIS — Z7901 Long term (current) use of anticoagulants: Secondary | ICD-10-CM | POA: Diagnosis not present

## 2020-03-25 DIAGNOSIS — E538 Deficiency of other specified B group vitamins: Secondary | ICD-10-CM | POA: Insufficient documentation

## 2020-03-25 DIAGNOSIS — I4891 Unspecified atrial fibrillation: Secondary | ICD-10-CM | POA: Diagnosis not present

## 2020-03-25 DIAGNOSIS — Z79899 Other long term (current) drug therapy: Secondary | ICD-10-CM | POA: Diagnosis not present

## 2020-03-25 DIAGNOSIS — N529 Male erectile dysfunction, unspecified: Secondary | ICD-10-CM | POA: Insufficient documentation

## 2020-03-25 HISTORY — DX: Mild cognitive impairment of uncertain or unknown etiology: G31.84

## 2020-03-25 NOTE — Consult Note (Signed)
[facility] 03/25/2020    [name]. [place]         MRN: [id number]  PRIMARY CARE:  Edwyna Shell. Jacelyn Grip, MD    REFERRING:  Raynelle Bring, Eduardo Osier  DOB: 07-12-1950, 70 year old Male  PROVIDER:  Raynelle Bring, M.D.  SSN: [id number]  LOCATION:  [facility] [phone]    CC/HPI: CC: Prostate Cancer   PCP: Dr. 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 Gaynelle Arabian in 2017. His PSA was 1.69 at the time and his DRE was normal. 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 He did eventually undergo a TURP in August of 2018 for further evaluation of the prostate abnormality with resection of the left lateral lobe which proved to be benign prostatic tissue. He established care with me in 2019 upon Dr. Arlyn Leak retirement. 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 His PSA has remained relatively stable with his most recent PSA being 1.86 in November 2021. He proceeded with his most recent surveillance biopsy on 03/04/20 which demonstrated Gleason 3+4=7 adenocarcinoma with 5% of 1 out of 12 biopsy cores. This was located in the left mid gland consistent with the location of his prior positive biopsies. Only 10% of the malignancy area was noted to be pattern 4 disease.   Family history: None.   Imaging studies: None.   PMH: He has a history of atrial fibrillation (Eliquis) and hyperlipidemia.  PSH: No abdominal surgeries.   TNM stage: cT1c Nx Mx  PSA: 1.86   Gleason score: 3+4=7 (GG 2)  Biopsy (03/04/20): 1/12 cores positive  Left: L mid (5%, 3+4=7)  Right: Benign  Prostate volume: 52.3 cc  PSAD: 0.04   Nomogram  OC disease: 83%  EPE: 16%  SVI: 1%  LNI: 1%  PFS (5 year, 10 year): 95%, 92%   Urinary function: IPSS is 7.  Erectile function: SHIM score is 2.     ALLERGIES: No Allergies    MEDICATIONS: Levofloxacin 750 mg tablet Please take one tablet the morning of your biopsy.  Eliquis 5 mg tablet 1 tablet PO BID  Pravastatin Sodium 10 mg tablet Oral     GU PSH: Complex Uroflow - 2017 Cystoscopy TURP - 2017 Prostate Needle Biopsy - 03/04/2020, 2019, 2017       PSH Notes: Septoplasty   NON-GU PSH: Deviated Septum Surgery - 2016 Surgical Pathology, Gross And Microscopic Examination For Prostate Needle - 03/04/2020, 2019, 2017         GU PMH: Prostate Cancer - 03/04/2020, - 12/23/2019, - 2018 Urinary Urgency - 12/23/2019, Urinary urgency, - 2016 BPH w/LUTS - 2018, - 2017, - 2017, Benign localized hyperplasia of prostate with urinary obstruction, - 2016 Elevated PSA - 2018, - 2017 Dysuria - 2017 Nocturia - 2017 Male ED, unspecified, Inability to maintain erection - 2016 Urge incontinence, Urge incontinence of urine - 2016      PMH Notes:   1) Prostate cancer: He  underwent a TRUS biopsy of the prostate on 09/13/15 by Dr. Gaynelle Arabian due to an abnormal prostate ultrasound demonstrating a left complex cystic lesion. This demonstrated 5% involvement of 1 out of 12 biopsy cores to be positive for Gleason 3+3=6 adenocarcinoma. I assumed his care in September 2018.   Initial diagnosis: August 2017  TNM stage: cT1c Nx Mx  PSA at diagnosis: 1.69  Gleason score: 3+3=6  Biopsy (09/13/15): 12 core biopsy  Left: L lateral mid (5%, 3+3=6)  Right: Benign  Prostate volume: 47.2 cc   Surveillance:   Mar 2019: MRI - Unremarkable  Jul 2019: Confirmatory biopsy (26 cores) - 4/26 cores positive - L apex (3%, 3+3=6), L mid (10% and 5%,  3+3=6), L base (5%, 3+3=6), Vol 46.4 cc   2) BPH/LUTS: He has bothersome urgency, frequency, and urge incontinence. He has not been willing to give up caffeine.   Current treatment: Observation   Aug 2018: TURP of left lateral lobe due to abnormal prostate ultrasound with complex cystic lesions - Benign      NON-GU PMH: Atrial Fibrillation Hypercholesterolemia    FAMILY HISTORY: Acute Myocardial Infarction - Runs In Family CAD (coronary artery disease) - Runs In Family Deceased - Runs In Family   SOCIAL HISTORY: Marital Status: Married Preferred Language: English; Race: White Current Smoking Status: Patient has never smoked.  Has never drank.  Drinks 4+ caffeinated drinks per day.     Notes: Caffeine use, No alcohol use, Retired, Never a smoker, Married   REVIEW OF SYSTEMS:     GU Review Male:  Patient denies frequent urination, hard to postpone urination, burning/ pain with urination, get up at night to urinate, leakage of urine, stream starts and stops, trouble starting your streams, and have to strain to urinate .    Gastrointestinal (Upper):  Patient denies nausea and vomiting.    Gastrointestinal (Lower):  Patient denies diarrhea and constipation.    Constitutional:  Patient denies fever, night sweats, weight loss, and fatigue.    Skin:  Patient denies skin rash/ lesion and itching.    Eyes:  Patient denies blurred vision and double vision.    Ears/ Nose/ Throat:  Patient denies sore throat and sinus problems.    Hematologic/Lymphatic:  Patient denies swollen glands and easy bruising.    Cardiovascular:  Patient denies leg swelling and chest pains.    Respiratory:  Patient denies cough and shortness of breath.    Endocrine:  Patient denies excessive thirst.    Musculoskeletal:  Patient denies joint pain and back pain.    Neurological:  Patient denies headaches and dizziness.    Psychologic:  Patient denies depression and anxiety.    VITAL SIGNS: None     MULTI-SYSTEM  PHYSICAL EXAMINATION:      Constitutional: Well-nourished. No physical deformities. Normally developed. Good grooming.            Complexity of Data:   Lab Test Review:  PSA  Records Review:  Pathology Reports, Previous Patient Records    12/16/19 05/20/19 11/05/18 03/21/18 04/22/17 08/28/14  PSA  Total PSA 1.86 ng/mL 2.02 ng/mL 1.55 ng/mL 1.66 ng/mL 2.06 ng/dl 2.47     PROCEDURES: None   ASSESSMENT:     ICD-10 Details  1 GU:  Prostate Cancer - C61   2  Urge incontinence - N39.41    PLAN:   Schedule  Labs: 1 Week - PSA  Document  Letter(s):  Created for Patient: Clinical Summary   Notes:  1. Favorable  intermediate risk prostate cancer: I had a detailed discussion with Abbe Amsterdam and his wife today regarding his recent prostate biopsy results suggesting upgraded Gleason 3+4=7 adenocarcinoma. We did discuss the very low volume nature of his disease in very low percentage involvement of pattern 4 disease. The patient was counseled about the natural history of prostate cancer and the standard treatment options that are available for prostate cancer. It was explained to him how his age and life expectancy, clinical stage, Gleason score, and PSA affect his prognosis, the decision to proceed with additional staging studies, as well as how that information influences recommended treatment strategies. We discussed the roles for active surveillance, radiation therapy, surgical therapy, androgen deprivation, as well as ablative therapy options for the treatment of prostate cancer as appropriate to his individual cancer situation. We discussed the risks and benefits of these options with regard to their impact on cancer control and also in terms of potential adverse events, complications, and impact on quality of life particularly related to urinary and sexual function. The patient was encouraged to ask questions throughout the discussion today and all questions were answered to his stated satisfaction.  In addition, the patient was provided with and/or directed to appropriate resources and literature for further education about prostate cancer and treatment options.   His main concern regarding treatment is the potential urinary outcomes. He does have severe urinary urgency, frequency, and urge incontinence at baseline. However, he also, at baseline, drinks between 12-15 caffeinated drinks per day. He has seen considerable improvement when he has decreased his caffeine intake. He does not appear to be interested in radical prostatectomy as he adamantly does not wish to have to deal with incontinence. With regard to radiation, he understands the potential exacerbation of irritative voiding symptoms with a lower risk of incontinence. He also still has some interest in active surveillance which would likely be very appropriate considering his favorable intermediate risk disease especially considering the low volume of pattern 4 disease.   After a very long discussion, we have agreed to have him follow up in the next 2 weeks after significantly cutting back his caffeine to more fully assess his true baseline urinary symptoms without caffeine intake. He still may be leaning toward an initial active surveillance approach with plans proceed with a repeat biopsy approximately 1 year after his last biopsy. However, this also may change his risk of urinary toxicity related radiation therapy of his baseline symptoms are not as bad as they currently seem. At his request, he also have a PSA prior to that visit.   Cc: Dr. Yaakov Guthrie  Dr. Zola Button  Dr. Tyler Pita

## 2020-03-25 NOTE — Progress Notes (Signed)
[place] Psychosocial Distress Screening Spiritual Care  Met with Deakin and his wife Bethena Roys in [facility] to introduce [facility] team/resources, reviewing distress screen per protocol.  The patient scored a 2 on the Psychosocial Distress Thermometer which indicates mild distress. Also assessed for distress and other psychosocial needs.   ONCBCN DISTRESS SCREENING 03/25/2020  Screening Type Initial Screening  Distress experienced in past week (1-10) 2  Physical Problem type Sexual problems;Changes in urination  Referral to support programs Yes   Couple is struggling with the fact that there is no tidy, clear-cut solution to Dr Maqueda's diagnosis. Bethena Roys contrasts this situation to that of her daughter, who recently had a bilateral mastectomy for DCIS and expects curative results.) Provided empathic listening and normalization of feelings.   Follow up needed: No. Per couple, no other needs or concerns at this time. They are aware of ongoing chaplain/team availability, should interest arise or circumstances change.   [place], [place], [facility] Pager [phone] Voicemail [phone]

## 2020-03-25 NOTE — Progress Notes (Signed)
Reason for the request:    Prostate cancer  HPI: I was asked by Dr. Alinda Money to evaluate Dr. Nira Retort for the diagnosis of prostate cancer.  He is 70 year old man with history of hyperlipidemia but overall excellent health.  He was diagnosed with prostate cancer in 2017 under the care of Dr. Gaynelle Arabian at that time.  He was found to have Gleason score 3+3 = 6 with 5% involvement in 1 out of 12 cores with his initial PSA of 1.69.  He has resumed care with Dr. Alinda Money in 2018 and had to biopsies under active surveillance since that time.  His biopsy in January 2022 showed a Gleason score of 3+4 = 7 with only 5% involvement of his prostate.  His PSA in December 2021 was 1.86.  Medically, he has no complaints at this time and denies any urinary frequency urgency or hesitancy.  His performance status quality of life remained excellent.  He does not report any headaches, blurry vision, syncope or seizures. Does not report any fevers, chills or sweats.  Does not report any cough, wheezing or hemoptysis.  Does not report any chest pain, palpitation, orthopnea or leg edema.  Does not report any nausea, vomiting or abdominal pain.  Does not report any constipation or diarrhea.  Does not report any skeletal complaints.    Does not report frequency, urgency or hematuria.  Does not report any skin rashes or lesions. Does not report any heat or cold intolerance.  Does not report any lymphadenopathy or petechiae.  Does not report any anxiety or depression.  Remaining review of systems is negative.    Past Medical History:  Diagnosis Date  . BPH (benign prostatic hyperplasia)   . Hyperlipidemia   . Mild cognitive impairment   . Nocturia   . Persistent atrial fibrillation ([place])   . Prostate cancer ([place])   . Vitamin B12 deficiency 02/16/2020  . Wears glasses   :  Past Surgical History:  Procedure Laterality Date  . CARDIOVERSION N/A 06/13/2017   Procedure: CARDIOVERSION;  Surgeon: Sanda Klein, MD;  Location: [facility]  ENDOSCOPY;  Service: Cardiovascular;  Laterality: N/A;  . SEPTOPLASTY  1996  . TONSILLECTOMY  1955  . TRANSURETHRAL RESECTION OF PROSTATE N/A 09/15/2015   Procedure: TRANSURETHRAL RESECTION OF THE PROSTATE (TURP);  Surgeon: Carolan Clines, MD;  [facility];  Service: Urology;  Laterality: N/A;  :   Current Outpatient Medications:  .  apixaban (ELIQUIS) 5 MG TABS tablet, Take 1 tablet (5 mg total) by mouth 2 (two) times daily. Future refills per PCP office, Disp: 180 tablet, Rfl: 0 .  Cholecalciferol (VITAMIN D) 50 MCG (2000 UT) CAPS, 1 capsule, Disp: , Rfl:  .  Cyanocobalamin 1000 MCG CAPS, 1 ml, Disp: , Rfl:  .  donepezil (ARICEPT) 10 MG tablet, Take 1 tablet (10 mg total) by mouth at bedtime., Disp: 90 tablet, Rfl: 1 .  pravastatin (PRAVACHOL) 20 MG tablet, Take 20 mg by mouth at bedtime. , Disp: , Rfl: :  No Known Allergies:  Family History  Problem Relation Age of Onset  . Breast cancer Neg Hx   . Prostate cancer Neg Hx   . Colon cancer Neg Hx   . Pancreatic cancer Neg Hx   :  Social History   Socioeconomic History  . Marital status: Married    Spouse name: Bethena Roys  . Number of children: Not on file  . Years of education: Not on file  . Highest education level: Not on file  Occupational History  . 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 Smoking status: Never Smoker  . Smokeless tobacco: Never Used  Vaping Use  . Vaping Use: Never used  Substance and Sexual Activity  . Alcohol use: No  . Drug use: No  . Sexual activity: Not Currently  Other Topics Concern  . Not on file  Social History Narrative   Retired Pharmacist, community.  Lives with spouse in [place].   Social Determinants of Health   Financial Resource Strain: Not on file  Food Insecurity: Not on file  Transportation Needs: Not on file  Physical Activity: Not on file  Stress: Not on file  Social Connections: Not on file   Intimate Partner Violence: Not on file  :    MR BRAIN WO CONTRAST  Result Date: 03/02/2020  [facility] NEUROIMAGING REPORT STUDY DATE: 03/01/20 PATIENT NAME: [name], MD DOB: 1950-08-19 MRN: [id number] ORDERING CLINICIAN: Kathrynn Ducking, MD CLINICAL HISTORY: 70 year old male with memory loss. EXAM: MR BRAIN WO CONTRAST TECHNIQUE: MRI of the brain without contrast was obtained utilizing 5 mm axial slices with T1, T2, T2 flair, SWI and diffusion weighted views.  T1 sagittal and T2 coronal views were obtained. CONTRAST: none COMPARISON: none IMAGING SITE: [facility] [address] (1.5 Tesla MRI)  FINDINGS: No abnormal lesions are seen on diffusion-weighted views to suggest acute ischemia. The cortical sulci, fissures and cisterns are notable for mild-moderate perisylvian and mesial temporal atrophy. Lateral, third and fourth ventricle are mildly enlarged on ex vacuo basis. in size and appearance. No extra-axial fluid collections are seen.  Slight asymmetry / deformation of the right midbrain which is smaller than the left side. Subtle foci of T1 hypointensity, T2 hyperintensity are noted within the right midbrain.  Nearby serpiginous SWI hyperintensities noted, raising possibility of underlying vascular malformation such as developmental venous anomaly.  Other considerations include enlarged perivascular spaces or other congenital anatomic variants / etiologies.  No other evidence of mass effect or midline shift.  Minimal punctate foci of non-specific T2 hyperintensities. On sagittal views the posterior fossa, pituitary gland and corpus callosum are unremarkable. No evidence of intracranial hemorrhage on SWI views. The orbits and their contents, paranasal sinuses and calvarium are notable for right maxillary mucus retention cyst.   Intracranial flow voids are present.  Right vertebral artery is hypoplastic and left vertebral artery is dominant.   MRI brain  (without) demonstrating: - Mild-moderate perisylvian and mesial temporal atrophy. - Slight asymmetry of right midbrain, smaller than left side, as noted above.  Considerations include underlying congenital etiology, developmental venous anomaly or enlarged perivascular spaces.  Consider follow-up MRI with and without contrast as clinically indicated. - No acute findings. INTERPRETING PHYSICIAN: Penni Bombard, MD Certified in Neurology, Neurophysiology and Neuroimaging [facility] [address] [place], [place] [address] [phone]    Assessment and Plan:   70 year old with prostate cancer diagnosed in 2017.  He was found to have a Gleason score 3+3 = 6 and 1 out of 4 cores with PSA of 1.86 in November 2021.  Repeat biopsy in January 2022 showed a Gleason score of 3+4 = 7 and only 5% of the core.  His case was discussed today in the prostate cancer multidisciplinary clinic including review of his pathology results.  His findings were discussed today in person with the patient and his wife.  Treatment options  were reviewed at this time which include continued active surveillance versus definitive therapy.  Immunotherapy would be in the form of radical prostatectomy versus radiation.  Risks and benefits of all these options were reviewed today as well as the risk of developing metastatic disease were discussed.  He understands regardless of his approach risk of metastasis remains low.  After discussion he is interested in definitive therapy particularly radiation given the toxicities associated with radical prostatectomy for his age.  He will consider these options and decide in the near future.  There is no role for any systemic therapy at this time.  30  minutes were dedicated to this visit. The time was spent on reviewing laboratory data, discussing treatment options, discussing questions regarding future plan.    A copy of this consult has been forwarded to the  requesting physician.

## 2020-03-25 NOTE — Progress Notes (Signed)
                               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 Raynelle Bring, [facility]  Radiation Oncologist - Dr. Tyler Pita, [facility]   Medical Oncologist - Dr. Zola Button, [facility]  Recommendations: 1) Active surveillance/ external beam radiation     * These recommendations are based on information available as of today's consult.      Recommendations may change depending on the results of further tests or exams.    Next Steps: 1) Consider your options and call Robin with decision.    When appointments need to be scheduled, you will be contacted by [facility] and/or [facility].  Questions?  Please do not hesitate to call Cira Rue, RN, BSN, OCN at [phone] any questions or concerns.  Shirlean Mylar is your Oncology Nurse Navigator and is available to assist you while you're receiving your medical care at [facility].

## 2020-03-25 NOTE — Progress Notes (Signed)
Radiation Oncology         [phone]) [phone] ________________________________  Multidisciplinary Prostate Cancer Clinic  Initial Radiation Oncology Consultation  Name: Hendrik Donath, MD MRN: [id number]  Date: 03/25/2020  DOB: 14-Apr-1950  WE:XHBZ, Edwyna Shell, MD  Raynelle Bring, MD   REFERRING PHYSICIAN: Raynelle Bring, MD  DIAGNOSIS: 70 y.o. gentleman with stage T1c adenocarcinoma of the prostate with a Gleason's score of 3+4 and a PSA of 1.86    ICD-10-CM   1. Malignant neoplasm of prostate ([place])  C61   2. 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 He was initially referred to Dr. Gaynelle Arabian in 2017 for cystic areas in the prostate noted on pelvic ultrasound. His PSA was in the normal range. He underwent prostate biopsy on 09/13/15 and was diagnosed with low volume, Gleason 3+3 prostate cancer (5% of one core). He also underwent TURP on 09/15/15, and pathology was benign. They appropriately opted for active surveillance.  With Dr. Arlyn Leak retirement, his care was transferred to Dr. Alinda Money in 11/01/2016.  His PSA remained stable at 2.0 and a surveillance prostate MRI in 04/2017 was benign. He had a confirmatory prostate biopsy with 26 samples on 09/06/17 under the care of Dr. Alinda Money, again showing low volume 3+3 prostate cancer (four cores showing 10% or less).  The PSA remained in the 1-2 range with his most recent PSA at 1.86 in November 2021 and digital rectal examination performed at that time also remained benign. The patient proceeded to another surveillance transrectal ultrasound with 12 biopsies of the prostate on 03/04/20.  The prostate volume measured 52.3 cc.  Out of 12 core biopsies, 1 was positive.  A small focus of Gleason 3+4 was seen in the left mid gland.  Dr. Nira Retort reviewed the biopsy results with his urologist and he has kindly been referred today to the multidisciplinary prostate  cancer clinic for presentation of pathology and radiology studies in our conference for discussion of potential radiation treatment options and clinical evaluation.  PREVIOUS RADIATION THERAPY: No  PAST MEDICAL HISTORY:  has a past medical history of BPH (benign prostatic hyperplasia), Hyperlipidemia, Mild cognitive impairment, Nocturia, Persistent atrial fibrillation ([place]), Prostate cancer ([place]), Vitamin B12 deficiency (02/16/2020), and Wears glasses.    PAST SURGICAL HISTORY: Past Surgical History:  Procedure Laterality Date  . CARDIOVERSION N/A 06/13/2017   Procedure: CARDIOVERSION;  Surgeon: Sanda Klein, MD;  Location: [facility];  Service: Cardiovascular;  Laterality: N/A;  . SEPTOPLASTY  1996  . TONSILLECTOMY  1955  . TRANSURETHRAL RESECTION OF PROSTATE N/A 09/15/2015   Procedure: TRANSURETHRAL RESECTION OF THE PROSTATE (TURP);  Surgeon: Carolan Clines, MD;  Location: [facility];  Service: Urology;  Laterality: N/A;    FAMILY HISTORY: family history is not on file.  SOCIAL HISTORY:  reports that he has never smoked. He has never used smokeless tobacco. He reports that he does not drink alcohol and does not use drugs.  ALLERGIES: Patient has no known allergies.  MEDICATIONS:  Current Outpatient Medications  Medication Sig Dispense Refill  . apixaban (ELIQUIS) 5 MG TABS tablet Take 1 tablet (5 mg total) by mouth 2 (two) times daily. Future refills per PCP office 180 tablet 0  . Cholecalciferol (VITAMIN D) 50 MCG (2000 UT) CAPS 1 capsule    . Cyanocobalamin 1000 MCG CAPS 1 ml    . donepezil (ARICEPT) 10 MG tablet Take 1 tablet (10 mg  total) by mouth at bedtime. 90 tablet 1  . pravastatin (PRAVACHOL) 20 MG tablet Take 20 mg by mouth at bedtime.      No current facility-administered medications for this encounter.    REVIEW OF SYSTEMS:  On review of systems, the patient reports that he is doing well overall. He denies any chest pain, shortness of breath, cough,  fevers, chills, night sweats, unintended weight changes. He denies any bowel disturbances, and denies abdominal pain, nausea or vomiting. He denies any new musculoskeletal or joint aches or pains. His IPSS was 7, indicating mild urinary symptoms although he reports significant bother with frequency, urgency and urge incontinence.  He denies any true obstructive symptoms such as hesitancy, intermittency, weak stream, straining to void or feelings of incomplete emptying.  He admits to heavy caffeine intake in the form of coffee and sodas daily.  He also reports significant improvement in his LUTS with Rapaflo in the past but the medication was not covered by his insurance so he was unable to continue on this medication.  He tried Flomax in place of the Rapaflo but did not notice any real improvement with this medication.  His SHIM was 6, indicating he has severe erectile dysfunction. A complete review of systems is obtained and is otherwise negative.   PHYSICAL EXAM:  Wt Readings from Last 3 Encounters:  03/25/20 224 lb 4 oz (101.7 kg)  02/16/20 221 lb 6.4 oz (100.4 kg)  11/21/17 187 lb (84.8 kg)   Temp Readings from Last 3 Encounters:  03/25/20 (!) 96.9 F (36.1 C) (Temporal)  06/13/17 (!) 97 F (36.1 C) (Axillary)  09/15/15 97.2 F (36.2 C)   BP Readings from Last 3 Encounters:  03/25/20 132/72  02/16/20 119/71  11/21/17 (!) 112/58   Pulse Readings from Last 3 Encounters:  03/25/20 66  02/16/20 81  11/21/17 74   Pain Assessment Pain Score: 0-No pain/10  In general this is a well appearing Caucasian male in no acute distress. He's alert and oriented x4 and appropriate throughout the examination. Cardiopulmonary assessment is negative for acute distress and he exhibits normal effort.    KPS = 100  100 - Normal; no complaints; no evidence of disease. 90   - Able to carry on normal activity; minor signs or symptoms of disease. 80   - Normal activity with effort; some signs or symptoms  of disease. 25   - Cares for self; unable to carry on normal activity or to do active work. 60   - Requires occasional assistance, but is able to care for most of his personal needs. 50   - Requires considerable assistance and frequent medical care. 38   - Disabled; requires special care and assistance. 24   - Severely disabled; hospital admission is indicated although death not imminent. 18   - Very sick; hospital admission necessary; active supportive treatment necessary. 10   - Moribund; fatal processes progressing rapidly. 0     - Dead  Karnofsky DA, Abelmann Brimfield, Craver LS and Burchenal [facility] [phone]) The use of the nitrogen mustards in the palliative treatment of carcinoma: with particular reference to bronchogenic carcinoma Cancer 1 [id number]   LABORATORY DATA:  Lab Results  Component Value Date   WBC 5.0 06/10/2017   HGB 14.0 06/10/2017   HCT 42.9 06/10/2017   MCV 94 06/10/2017   PLT 179 06/10/2017   Lab Results  Component Value Date   NA 141 06/10/2017   K 4.4 06/10/2017   CL 104  06/10/2017   CO2 22 06/10/2017   No results found for: ALT, AST, GGT, ALKPHOS, BILITOT   RADIOGRAPHY: MR BRAIN WO CONTRAST  Result Date: 03/02/2020  [facility] NEUROIMAGING REPORT STUDY DATE: 03/01/20 PATIENT NAME: [name], 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 EXAM: MR BRAIN WO CONTRAST TECHNIQUE: MRI of the brain without contrast was obtained utilizing 5 mm axial slices with T1, T2, T2 flair, SWI and diffusion weighted views.  T1 sagittal and T2 coronal views were obtained. CONTRAST: none COMPARISON: none IMAGING SITE: [facility] [address] (1.5 Tesla MRI)  FINDINGS: No abnormal lesions are seen on diffusion-weighted views to suggest acute ischemia. The cortical sulci, fissures and cisterns are notable for mild-moderate perisylvian and mesial temporal atrophy.  Lateral, third and fourth ventricle are mildly enlarged on ex vacuo basis. in size and appearance. No extra-axial fluid collections are seen.  Slight asymmetry / deformation of the right midbrain which is smaller than the left side. Subtle foci of T1 hypointensity, T2 hyperintensity are noted within the right midbrain.  Nearby serpiginous SWI hyperintensities noted, raising possibility of underlying vascular malformation such as developmental venous anomaly.  Other considerations include enlarged perivascular spaces or other congenital anatomic variants / etiologies.  No other evidence of mass effect or midline shift.  Minimal punctate foci of non-specific T2 hyperintensities. On sagittal views the posterior fossa, pituitary gland and corpus callosum are unremarkable. No evidence of intracranial hemorrhage on SWI views. The orbits and their contents, paranasal sinuses and calvarium are notable for right maxillary mucus retention cyst.   Intracranial flow voids are present.  Right vertebral artery is hypoplastic and left vertebral artery is dominant.   MRI brain (without) demonstrating: - Mild-moderate perisylvian and mesial temporal atrophy. - Slight asymmetry of right midbrain, smaller than left side, as noted above.  Considerations include underlying congenital etiology, developmental venous anomaly or enlarged perivascular spaces.  Consider follow-up MRI with and without contrast as clinically indicated. - No acute findings. INTERPRETING PHYSICIAN: Penni Bombard, MD Certified in Neurology, Neurophysiology and Neuroimaging [facility] [address] [place], [place] [address] [phone]      IMPRESSION/PLAN: 70 y.o. gentleman with Stage T1c adenocarcinoma of the prostate with a Gleason score of 3+4 and a PSA of 1.86.    We discussed the patient's workup and outlined the nature of prostate cancer in this setting. The patient's T stage, Gleason's score, and PSA put him into  the favorable intermediate risk group. Accordingly, he is eligible for a variety of potential treatment options including brachytherapy, 5.5 weeks of external radiation, or prostatectomy. We discussed the available radiation techniques, and focused on the details and logistics of delivery.  He is not an ideal candidate for brachytherapy given his prior TURP procedure.  Therefore, we discussed and outlined the risks, benefits, short and long-term effects associated with daily external beam radiotherapy and compared and contrasted these with prostatectomy. We discussed the role of SpaceOAR gel in reducing the rectal toxicity associated with radiotherapy. He appears to have a good understanding of his disease and our treatment recommendations which are of curative intent.  He and his wife were encouraged to ask questions that were answered to their stated satisfaction.  At the end of the conversation, Dr. Nira Retort is interested in moving forward with 5.5 weeks of external beam therapy but would like to discuss other therapeutic options to maximize  improvement of the irritative voiding symptoms prior to starting radiation. We will share our discussion with Dr. Alinda Money for consideration of further evaluation, possibly urodynamics, and/for medical therapy options.  We will remain in close communication, following his progress to determine the best time to proceed with treatment.  When he is ready, we will coordinate for fiducial markers and SpaceOAR gel placement, prior to simulation, to reduce rectal toxicity from radiotherapy. The patient appears to have a good understanding of his disease and our treatment recommendations which are of curative intent and is in agreement with the stated plan.  We enjoyed meeting he and his wife today and they know that they are welcome to call at anytime with any further questions or concerns related to radiation.Nicholos Johns, PA-C    Tyler Pita, MD  [facility] Direct Dial: [phone]  Fax: [phone] [email or web address]  Skype  LinkedIn   This document serves as a record of services personally performed by Tyler Pita, MD and Freeman Caldron, PA-C. It was created on their behalf by Wilburn Mylar, a trained medical scribe. The creation of this record is based on the scribe's personal observations and the provider's statements to them. This document has been checked and approved by the attending provider.

## 2020-03-30 DIAGNOSIS — L820 Inflamed seborrheic keratosis: Secondary | ICD-10-CM | POA: Diagnosis not present

## 2020-03-30 DIAGNOSIS — D225 Melanocytic nevi of trunk: Secondary | ICD-10-CM | POA: Diagnosis not present

## 2020-03-30 DIAGNOSIS — Z1283 Encounter for screening for malignant neoplasm of skin: Secondary | ICD-10-CM | POA: Diagnosis not present

## 2020-04-05 DIAGNOSIS — C61 Malignant neoplasm of prostate: Secondary | ICD-10-CM | POA: Diagnosis not present

## 2020-04-12 DIAGNOSIS — C61 Malignant neoplasm of prostate: Secondary | ICD-10-CM | POA: Diagnosis not present

## 2020-04-12 DIAGNOSIS — N3941 Urge incontinence: Secondary | ICD-10-CM | POA: Diagnosis not present

## 2020-04-17 DIAGNOSIS — Z20822 Contact with and (suspected) exposure to covid-19: Secondary | ICD-10-CM | POA: Diagnosis not present

## 2020-04-18 DIAGNOSIS — Z20822 Contact with and (suspected) exposure to covid-19: Secondary | ICD-10-CM | POA: Diagnosis not present

## 2020-05-17 DIAGNOSIS — N3941 Urge incontinence: Secondary | ICD-10-CM | POA: Diagnosis not present

## 2020-05-17 DIAGNOSIS — N401 Enlarged prostate with lower urinary tract symptoms: Secondary | ICD-10-CM | POA: Diagnosis not present

## 2020-07-20 DIAGNOSIS — E569 Vitamin deficiency, unspecified: Secondary | ICD-10-CM | POA: Diagnosis not present

## 2020-07-20 DIAGNOSIS — D6869 Other thrombophilia: Secondary | ICD-10-CM | POA: Diagnosis not present

## 2020-07-20 DIAGNOSIS — G479 Sleep disorder, unspecified: Secondary | ICD-10-CM | POA: Diagnosis not present

## 2020-07-20 DIAGNOSIS — C61 Malignant neoplasm of prostate: Secondary | ICD-10-CM | POA: Diagnosis not present

## 2020-07-20 DIAGNOSIS — Z6826 Body mass index (BMI) 26.0-26.9, adult: Secondary | ICD-10-CM | POA: Diagnosis not present

## 2020-07-20 DIAGNOSIS — Z8639 Personal history of other endocrine, nutritional and metabolic disease: Secondary | ICD-10-CM | POA: Diagnosis not present

## 2020-07-20 DIAGNOSIS — Z630 Problems in relationship with spouse or partner: Secondary | ICD-10-CM | POA: Diagnosis not present

## 2020-07-20 DIAGNOSIS — E559 Vitamin D deficiency, unspecified: Secondary | ICD-10-CM | POA: Diagnosis not present

## 2020-07-20 DIAGNOSIS — E7800 Pure hypercholesterolemia, unspecified: Secondary | ICD-10-CM | POA: Diagnosis not present

## 2020-07-20 DIAGNOSIS — R413 Other amnesia: Secondary | ICD-10-CM | POA: Diagnosis not present

## 2020-07-20 DIAGNOSIS — E538 Deficiency of other specified B group vitamins: Secondary | ICD-10-CM | POA: Diagnosis not present

## 2020-07-20 DIAGNOSIS — I4819 Other persistent atrial fibrillation: Secondary | ICD-10-CM | POA: Diagnosis not present

## 2020-07-29 DIAGNOSIS — C61 Malignant neoplasm of prostate: Secondary | ICD-10-CM | POA: Diagnosis not present

## 2020-07-29 DIAGNOSIS — E538 Deficiency of other specified B group vitamins: Secondary | ICD-10-CM | POA: Diagnosis not present

## 2020-07-29 DIAGNOSIS — Z8639 Personal history of other endocrine, nutritional and metabolic disease: Secondary | ICD-10-CM | POA: Diagnosis not present

## 2020-07-29 DIAGNOSIS — I4819 Other persistent atrial fibrillation: Secondary | ICD-10-CM | POA: Diagnosis not present

## 2020-07-29 DIAGNOSIS — E7800 Pure hypercholesterolemia, unspecified: Secondary | ICD-10-CM | POA: Diagnosis not present

## 2020-07-29 DIAGNOSIS — R413 Other amnesia: Secondary | ICD-10-CM | POA: Diagnosis not present

## 2020-07-29 DIAGNOSIS — E559 Vitamin D deficiency, unspecified: Secondary | ICD-10-CM | POA: Diagnosis not present

## 2020-07-29 DIAGNOSIS — Z0000 Encounter for general adult medical examination without abnormal findings: Secondary | ICD-10-CM | POA: Diagnosis not present

## 2020-08-17 ENCOUNTER — Ambulatory Visit: Payer: PPO | Admitting: Neurology

## 2020-09-13 ENCOUNTER — Encounter: Payer: Self-pay | Admitting: Urology

## 2020-09-13 NOTE — Progress Notes (Signed)
Per Dr. Lynne Logan office note dated 04/12/2020, Dr. Nira Retort has decided to proceed with active surveillance and tentatively plans to have a repeat TRUSPBx in November 2022.  Based on those results, he will make a decision regarding proceeding with treatment versus continuing active surveillance at that time. -Brady Martinez

## 2020-10-23 DIAGNOSIS — Z20822 Contact with and (suspected) exposure to covid-19: Secondary | ICD-10-CM | POA: Diagnosis not present

## 2020-11-10 ENCOUNTER — Telehealth: Payer: Self-pay | Admitting: Neurology

## 2020-11-10 NOTE — Telephone Encounter (Signed)
Any work ins for this pt ?

## 2020-11-10 NOTE — Telephone Encounter (Signed)
Pt's wife states pt is needing a f/u but she is very much wanting that to be with Dr Krista Blue, which is who pt was originally supposed to see.  Please call if ok to schedule pt with Dr Krista Blue for continued f/u appointment.

## 2020-11-10 NOTE — Telephone Encounter (Signed)
Pt's wife was called(per her request) to see about scheduling with Dr Krista Blue, a vm was left with # to office asking that she calls to schedule an appointment.  This is FYI for POD 2

## 2020-11-22 NOTE — Telephone Encounter (Signed)
Pt's wife called wanting her husband to be seen before 12/22/2020. Wife is requesting a call back.

## 2020-11-22 NOTE — Telephone Encounter (Signed)
I also called patient's wife. She would like a sooner appointment than 12/01/20. There is an opening on 11/28/20 at 7:30am with Dr. Krista Blue and patient's wife accepted this appointment.

## 2020-11-22 NOTE — Telephone Encounter (Signed)
I called pt's wife back and we have moved appointment up 12/01/2020 at 100 pm with Dr. Krista Blue. Pt will be transfer from Dr. Jannifer Franklin due to retirement.

## 2020-11-24 DIAGNOSIS — E538 Deficiency of other specified B group vitamins: Secondary | ICD-10-CM | POA: Diagnosis not present

## 2020-11-24 DIAGNOSIS — E559 Vitamin D deficiency, unspecified: Secondary | ICD-10-CM | POA: Diagnosis not present

## 2020-11-24 DIAGNOSIS — D510 Vitamin B12 deficiency anemia due to intrinsic factor deficiency: Secondary | ICD-10-CM | POA: Diagnosis not present

## 2020-11-28 ENCOUNTER — Ambulatory Visit: Payer: PPO | Admitting: Neurology

## 2020-11-28 ENCOUNTER — Encounter: Payer: Self-pay | Admitting: Neurology

## 2020-11-28 VITALS — BP 121/72 | HR 66 | Ht 75.0 in | Wt 222.0 lb

## 2020-11-28 DIAGNOSIS — E538 Deficiency of other specified B group vitamins: Secondary | ICD-10-CM

## 2020-11-28 DIAGNOSIS — G3184 Mild cognitive impairment, so stated: Secondary | ICD-10-CM

## 2020-11-28 MED ORDER — DONEPEZIL HCL 10 MG PO TABS: 10.0000 mg | ORAL_TABLET | Freq: Every day | ORAL | 4 refills | Status: DC

## 2020-11-28 MED ORDER — MEMANTINE HCL 10 MG PO TABS: 10.0000 mg | ORAL_TABLET | Freq: Two times a day (BID) | ORAL | 4 refills | Status: DC

## 2020-11-28 NOTE — Progress Notes (Signed)
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  MRI brain (without) on March 01, 2020 - Mild-moderate perisylvian and mesial temporal atrophy. - Slight asymmetry of right midbrain, smaller than left side, as noted above.  Considerations include underlying congenital etiology, developmental venous anomaly or enlarged perivascular spaces.  Consider follow-up MRI with and without contrast as clinically indicated. - No acute findings.    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 PMHx. Afib, on eliquis. 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  He was seen by Dr. Jannifer Franklin in January 2022,  Personally reviewed MRI of the brain in January 2022, no acute abnormality, mild to moderate perisylvian and mesial temporal lobe atrophy,  He reported mildly low vitamin B12, improved with supplements, otherwise no other treatable etiology found  He was started on Aricept 5 mg following visit in January 2022, but he only  take it intermittently  His wife is very anxious about his cognitive change, came in with a list of symptoms, such as loss to [place] from [place] even with GPS, difficulty handling oven,  Patient eat healthy diet, exercise regularly, denies difficulty sleeping,  His wife seems very anxious about his symptoms  PHYSICAL EXAM:   Vitals:   11/28/20 0727  BP: 121/72  Pulse: 66  Weight: 222 lb (100.7 kg)  Height: 6\' 3"  (1.905 m)   Not recorded     Body mass index is 27.75 kg/m.  PHYSICAL EXAMNIATION:  Gen: NAD, conversant, well nourised, well groomed                     Cardiovascular: Regular rate rhythm, no peripheral edema, warm, nontender. Eyes: Conjunctivae clear without exudates or hemorrhage Neck: Supple, no carotid bruits. Pulmonary: Clear to auscultation bilaterally   NEUROLOGICAL EXAM:  MENTAL STATUS: Speech:    Speech is normal; fluent and spontaneous with normal comprehension.  Cognition:      Montreal Cognitive Assessment  11/28/2020  Visuospatial/ Executive (0/5) 5  Naming (0/3) 3  Attention: Read list of digits (0/2) 2  Attention: Read list of letters (0/1) 1  Attention: Serial 7 subtraction starting at 100 (0/3) 3  Language: Repeat phrase (0/2) 2  Language : Fluency (0/1) 1  Abstraction (0/2) 2  Delayed Recall (0/5) 5  Orientation (0/6) 6  Total 30  Adjusted Score (based on education) 30      CRANIAL NERVES: CN II: Visual fields are full to confrontation. Pupils are round equal and briskly reactive to light. CN III, IV, VI: extraocular  movement are normal. No ptosis. CN V: Facial sensation is intact to light touch CN VII: Face is symmetric with normal eye closure  CN VIII: Hearing is normal to causal conversation. CN IX, X: Phonation is normal. CN XI: Head turning and shoulder shrug are intact  MOTOR: There is no pronator drift of out-stretched arms. Muscle bulk and tone are normal. Muscle strength is normal.  REFLEXES: Reflexes are 2+ and  symmetric at the biceps, triceps, knees, and ankles. Plantar responses are flexor.  SENSORY: Intact to light touch, pinprick and vibratory sensation are intact in fingers and toes.  COORDINATION: There is no trunk or limb dysmetria noted.  GAIT/STANCE: Leaning forward, steady gait   REVIEW OF SYSTEMS:  Full 14 system review of systems performed and notable only for as above All other review of systems were negative.   ALLERGIES: No Known Allergies  HOME MEDICATIONS: Current Outpatient Medications  Medication Sig Dispense Refill   apixaban (ELIQUIS) 5 MG TABS tablet Take 1 tablet (5 mg total) by mouth 2 (two) times daily. Future refills per PCP office 180 tablet 0   Cholecalciferol (VITAMIN D) 50 MCG (2000 UT) CAPS 1 capsule     Cyanocobalamin 1000 MCG CAPS 1 ml     donepezil (ARICEPT) 10 MG tablet Take 1 tablet (10 mg total) by mouth at bedtime. 90 tablet 1   pravastatin (PRAVACHOL) 20 MG tablet Take 20 mg by mouth at bedtime.      No current facility-administered medications for this visit.    PAST MEDICAL HISTORY: Past Medical History:  Diagnosis Date   BPH (benign prostatic hyperplasia)    Hyperlipidemia    Mild cognitive impairment    Nocturia    Persistent atrial fibrillation ([facility])    Prostate cancer ([place])    Vitamin B12 deficiency 02/16/2020   Wears glasses     PAST SURGICAL HISTORY: Past Surgical History:  Procedure Laterality Date   CARDIOVERSION N/A 06/13/2017   Procedure: CARDIOVERSION;  Surgeon: Sanda Klein, MD;  Location: [place];  Service: Cardiovascular;  Laterality: N/A;   [place] N/A 09/15/2015   Procedure: TRANSURETHRAL RESECTION OF THE PROSTATE (TURP);  Surgeon: Carolan Clines, MD;  Location[facility];  Service: Urology;  Laterality: N/A;    FAMILY HISTORY: Family History  Problem Relation Age of Onset   Breast cancer Neg Hx    Prostate cancer Neg  Hx    Colon cancer Neg Hx    Pancreatic cancer Neg Hx     SOCIAL HISTORY: Social History   Socioeconomic History   Marital status: Married    Spouse name: Bethena Roys   Number of children: Not on file   Years of education: Not on file   Highest education level: Not on file  Occupational History   Occupation: Physician    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  Lives with spouse in [place].   Social Determinants of Health   Financial Resource Strain: Not on file  Food Insecurity: Not on file  Transportation Needs: Not on file  Physical  Activity: Not on file  Stress: Not on file  Social Connections: Not on file  Intimate Partner Violence: Not on file      Marcial Pacas, M.D. Ph.D.  [facility] [address] [place], [place] [address] Ph: [phone] Fax: [phone]  CC:  Vernie Shanks, MD [place],  [place] [address]  Vernie Shanks, MD

## 2020-12-01 ENCOUNTER — Ambulatory Visit: Payer: PPO | Admitting: Neurology

## 2020-12-22 ENCOUNTER — Ambulatory Visit: Payer: PPO | Admitting: Neurology

## 2021-01-02 DIAGNOSIS — C61 Malignant neoplasm of prostate: Secondary | ICD-10-CM | POA: Diagnosis not present

## 2021-01-17 DIAGNOSIS — E7800 Pure hypercholesterolemia, unspecified: Secondary | ICD-10-CM | POA: Diagnosis not present

## 2021-01-17 DIAGNOSIS — C61 Malignant neoplasm of prostate: Secondary | ICD-10-CM | POA: Diagnosis not present

## 2021-01-17 DIAGNOSIS — Z8639 Personal history of other endocrine, nutritional and metabolic disease: Secondary | ICD-10-CM | POA: Diagnosis not present

## 2021-01-17 DIAGNOSIS — E559 Vitamin D deficiency, unspecified: Secondary | ICD-10-CM | POA: Diagnosis not present

## 2021-01-17 DIAGNOSIS — E538 Deficiency of other specified B group vitamins: Secondary | ICD-10-CM | POA: Diagnosis not present

## 2021-01-25 DIAGNOSIS — Z20822 Contact with and (suspected) exposure to covid-19: Secondary | ICD-10-CM | POA: Diagnosis not present

## 2021-01-27 ENCOUNTER — Ambulatory Visit: Payer: PPO | Admitting: Cardiovascular Disease

## 2021-02-16 ENCOUNTER — Ambulatory Visit: Payer: PPO | Admitting: Interventional Cardiology

## 2021-02-16 ENCOUNTER — Other Ambulatory Visit: Payer: Self-pay

## 2021-02-16 ENCOUNTER — Ambulatory Visit: Payer: PPO | Admitting: Neurology

## 2021-02-16 ENCOUNTER — Encounter: Payer: Self-pay | Admitting: Interventional Cardiology

## 2021-02-16 VITALS — BP 126/70 | HR 66 | Ht 75.0 in | Wt 232.0 lb

## 2021-02-16 DIAGNOSIS — I4821 Permanent atrial fibrillation: Secondary | ICD-10-CM

## 2021-02-16 DIAGNOSIS — R600 Localized edema: Secondary | ICD-10-CM | POA: Diagnosis not present

## 2021-02-16 DIAGNOSIS — Z7901 Long term (current) use of anticoagulants: Secondary | ICD-10-CM | POA: Diagnosis not present

## 2021-02-16 DIAGNOSIS — E782 Mixed hyperlipidemia: Secondary | ICD-10-CM

## 2021-02-16 MED ORDER — ROSUVASTATIN CALCIUM 10 MG PO TABS: 10.0000 mg | ORAL_TABLET | Freq: Every day | ORAL | 3 refills | Status: DC

## 2021-02-16 MED ORDER — APIXABAN 5 MG PO TABS: 5.0000 mg | ORAL_TABLET | Freq: Two times a day (BID) | ORAL | 3 refills | Status: DC

## 2021-02-16 NOTE — Progress Notes (Signed)
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  He has been followed by Dr. Rayann Heman and the atrial fibrillation clinic.  AADs were offered but he chose rate control at that time.   He had a DCCV in 2018 which did not last.   With Dr. Rayann Heman leaving the practice in 2023, he is following up with me.  Works out 5-6 days a week.  Restricted by his knees only.    Denies : Chest pain. Dizziness. Leg edema. Nitroglycerin use. Orthopnea. Palpitations. Paroxysmal nocturnal dyspnea. Shortness of breath. Syncope.    Looking for new PCP.  Was dismissed by [facility], Dr. Jacelyn Grip.  Past Medical History:  Diagnosis Date   BPH (benign prostatic hyperplasia)    Hyperlipidemia    Mild cognitive impairment    Nocturia    Persistent atrial fibrillation (HCC)    Prostate cancer ([place])    Vitamin B12 deficiency 02/16/2020   Wears glasses     Past Surgical History:  Procedure Laterality Date   CARDIOVERSION N/A 06/13/2017   Procedure: CARDIOVERSION;  Surgeon: Sanda Klein, MD;  Location: [facility];  Service: Cardiovascular;  Laterality: N/A;   [place] N/A 09/15/2015   Procedure: TRANSURETHRAL RESECTION OF THE PROSTATE (TURP);  Surgeon: Carolan Clines, MD;  Location: [facility];  Service: Urology;  Laterality: N/A;     Current Outpatient Medications  Medication Sig Dispense Refill   apixaban (ELIQUIS) 5 MG TABS tablet Take 1 tablet (5 mg total) by mouth 2 (two) times daily. Future refills per PCP office 180 tablet 0    Cholecalciferol (VITAMIN D3 PO) Take 2,000 Units by mouth daily.     Cyanocobalamin 1000 MCG CAPS 1 ml     donepezil (ARICEPT) 10 MG tablet Take 1 tablet (10 mg total) by mouth at bedtime. 90 tablet 4   memantine (NAMENDA) 10 MG tablet Take 1 tablet (10 mg total) by mouth 2 (two) times daily. 180 tablet 4   pravastatin (PRAVACHOL) 20 MG tablet Take 20 mg by mouth at bedtime.      No current facility-administered medications for this visit.    Allergies:   Patient has no known allergies.    Social History:  The patient  reports that he has never smoked. He has never used smokeless tobacco. He reports that he does not drink alcohol and does not use drugs.   Family History:  The patient's family history is not on file.    ROS:  Please see the history of present illness.   Otherwise, review of systems are positive for knee pain.   All other systems are reviewed and negative.    PHYSICAL EXAM: VS:  BP 126/70    Pulse 66    Ht 6\' 3"  (1.905 m)    Wt 232 lb (105.2 kg)    SpO2 99%    BMI 29.00 kg/m  , BMI Body mass index is 29 kg/m.  GEN: Well nourished, well developed, in no acute distress HEENT: normal Neck: no JVD, carotid bruits, or masses Cardiac: irregularly irregular; no murmurs, rubs, or gallops,; 1+ pitting bilateral LE edema  Respiratory:  clear to auscultation bilaterally, normal work of breathing GI: soft, nontender, nondistended, + BS MS: no deformity or atrophy Skin: warm and dry, no rash Neuro:  Strength and sensation are intact Psych: euthymic mood, full affect   EKG:   The ekg ordered today demonstrates atrial fibrillation with controlled ventricular response   Recent Labs: No results found for requested labs within last 8760 hours.   Lipid Panel No results found for: CHOL, TRIG, HDL, CHOLHDL, VLDL, LDLCALC, LDLDIRECT   Other studies Reviewed: Additional studies/ records that were reviewed today with results demonstrating: labs reviewed.   ASSESSMENT AND  PLAN:  Atrial fibrillation: Rate control strategy.  Continue current medications. Hyperlipidemia: Lipids reviewed.  Particle numbers were under control.  We discussed higher potency statin.  We will stop pravastatin and start rosuvastatin 10 mg daily. LE edema: Elevate legs.  Compression stockings prescription given. Anticoagulated: Hemoglobin and creatinine were stable in December 2022 at last check.  He will be finding a new primary care doctor to follow his labs.   Current medicines are reviewed at length with the patient today.  The patient concerns regarding his medicines were addressed.  The following changes have been made:   Labs/ tests ordered today include: lipids, liver No orders of the defined types were placed in this encounter.   Recommend 150 minutes/week of aerobic exercise Low fat, low carb, high fiber diet recommended  Disposition:   FU in 1 year   Signed, Larae Grooms, MD  02/16/2021 4:56 PM    [facility], [place], [place]  [address] Phone: [phone]; Fax: [phone]

## 2021-02-16 NOTE — Patient Instructions (Addendum)
Medication Instructions:  Your physician has recommended you make the following change in your medication: Stop Pravastatin. Start Rosuvastatin 10 mg by mouth daily.  *If you need a refill on your cardiac medications before your next appointment, please call your pharmacy*   Lab Work: Your physician recommends that you return for lab work in 3 months.  Lipid and liver profiles.  This will be fasting.  The lab opens at 7:30 AM--Scheduled for April 5  If you have labs (blood work) drawn today and your tests are completely normal, you will receive your results only by: [facility] (if you have MyChart) OR A paper copy in the mail If you have any lab test that is abnormal or we need to change your treatment, we will call you to review the results.   Testing/Procedures: none   Follow-Up: At [facility], you and your health needs are our priority.  As part of our continuing mission to provide you with exceptional heart care, we have created designated Provider Care Teams.  These Care Teams include your primary Cardiologist (physician) and Advanced Practice Providers (APPs -  Physician Assistants and Nurse Practitioners) who all work together to provide you with the care you need, when you need it.  We recommend signing up for the patient portal called "MyChart".  Sign up information is provided on this After Visit Summary.  MyChart is used to connect with patients for Virtual Visits (Telemedicine).  Patients are able to view lab/test results, encounter notes, upcoming appointments, etc.  Non-urgent messages can be sent to your provider as well.   To learn more about what you can do with MyChart, go to [email or web address].    Your next appointment:   12 month(s)  The format for your next appointment:   In Person  Provider:   Larae Grooms, MD     Other Instructions

## 2021-02-17 DIAGNOSIS — C61 Malignant neoplasm of prostate: Secondary | ICD-10-CM | POA: Diagnosis not present

## 2021-02-17 DIAGNOSIS — D075 Carcinoma in situ of prostate: Secondary | ICD-10-CM | POA: Diagnosis not present

## 2021-05-01 DIAGNOSIS — D2262 Melanocytic nevi of left upper limb, including shoulder: Secondary | ICD-10-CM | POA: Diagnosis not present

## 2021-05-01 DIAGNOSIS — L820 Inflamed seborrheic keratosis: Secondary | ICD-10-CM | POA: Diagnosis not present

## 2021-05-01 DIAGNOSIS — L821 Other seborrheic keratosis: Secondary | ICD-10-CM | POA: Diagnosis not present

## 2021-05-01 DIAGNOSIS — D225 Melanocytic nevi of trunk: Secondary | ICD-10-CM | POA: Diagnosis not present

## 2021-05-01 DIAGNOSIS — D485 Neoplasm of uncertain behavior of skin: Secondary | ICD-10-CM | POA: Diagnosis not present

## 2021-05-16 ENCOUNTER — Telehealth: Payer: Self-pay | Admitting: Interventional Cardiology

## 2021-05-16 DIAGNOSIS — I4821 Permanent atrial fibrillation: Secondary | ICD-10-CM

## 2021-05-16 DIAGNOSIS — Z79899 Other long term (current) drug therapy: Secondary | ICD-10-CM

## 2021-05-16 DIAGNOSIS — E782 Mixed hyperlipidemia: Secondary | ICD-10-CM

## 2021-05-16 DIAGNOSIS — Z8639 Personal history of other endocrine, nutritional and metabolic disease: Secondary | ICD-10-CM

## 2021-05-16 DIAGNOSIS — I4819 Other persistent atrial fibrillation: Secondary | ICD-10-CM

## 2021-05-16 DIAGNOSIS — D510 Vitamin B12 deficiency anemia due to intrinsic factor deficiency: Secondary | ICD-10-CM

## 2021-05-16 DIAGNOSIS — E559 Vitamin D deficiency, unspecified: Secondary | ICD-10-CM

## 2021-05-16 DIAGNOSIS — E538 Deficiency of other specified B group vitamins: Secondary | ICD-10-CM

## 2021-05-16 DIAGNOSIS — C61 Malignant neoplasm of prostate: Secondary | ICD-10-CM

## 2021-05-16 NOTE — Telephone Encounter (Signed)
Patient would like to add a VIT B12, VIT D3, CBC, CMET, TSH, and a A1C.  He is having labs done tomorrow.  ?

## 2021-05-16 NOTE — Telephone Encounter (Signed)
Dr. Nira Retort advised that we will add the labs requested on for his planned labs for 05/17/21.  ?

## 2021-05-17 ENCOUNTER — Other Ambulatory Visit: Payer: PPO | Admitting: *Deleted

## 2021-05-17 DIAGNOSIS — I4819 Other persistent atrial fibrillation: Secondary | ICD-10-CM | POA: Diagnosis not present

## 2021-05-17 DIAGNOSIS — Z79899 Other long term (current) drug therapy: Secondary | ICD-10-CM | POA: Diagnosis not present

## 2021-05-17 DIAGNOSIS — D510 Vitamin B12 deficiency anemia due to intrinsic factor deficiency: Secondary | ICD-10-CM | POA: Diagnosis not present

## 2021-05-17 DIAGNOSIS — E782 Mixed hyperlipidemia: Secondary | ICD-10-CM | POA: Diagnosis not present

## 2021-05-17 DIAGNOSIS — Z8639 Personal history of other endocrine, nutritional and metabolic disease: Secondary | ICD-10-CM

## 2021-05-17 DIAGNOSIS — E538 Deficiency of other specified B group vitamins: Secondary | ICD-10-CM | POA: Diagnosis not present

## 2021-05-17 DIAGNOSIS — E559 Vitamin D deficiency, unspecified: Secondary | ICD-10-CM

## 2021-05-17 DIAGNOSIS — C61 Malignant neoplasm of prostate: Secondary | ICD-10-CM

## 2021-05-17 DIAGNOSIS — I4821 Permanent atrial fibrillation: Secondary | ICD-10-CM

## 2021-05-17 LAB — COMPREHENSIVE METABOLIC PANEL
ALT: 17 IU/L (ref 0–44)
AST: 20 IU/L (ref 0–40)
Albumin/Globulin Ratio: 2.4 — ABNORMAL HIGH (ref 1.2–2.2)
Albumin: 4.4 g/dL (ref 3.7–4.7)
Alkaline Phosphatase: 64 IU/L (ref 44–121)
BUN/Creatinine Ratio: 18 (ref 10–24)
BUN: 19 mg/dL (ref 8–27)
Bilirubin Total: 0.7 mg/dL (ref 0.0–1.2)
CO2: 23 mmol/L (ref 20–29)
Calcium: 9.4 mg/dL (ref 8.6–10.2)
Chloride: 107 mmol/L — ABNORMAL HIGH (ref 96–106)
Creatinine, Ser: 1.06 mg/dL (ref 0.76–1.27)
Globulin, Total: 1.8 g/dL (ref 1.5–4.5)
Glucose: 83 mg/dL (ref 70–99)
Potassium: 4.3 mmol/L (ref 3.5–5.2)
Sodium: 142 mmol/L (ref 134–144)
Total Protein: 6.2 g/dL (ref 6.0–8.5)
eGFR: 75 mL/min/{1.73_m2} (ref >59)

## 2021-05-17 LAB — TSH: TSH: 1.86 u[IU]/mL (ref 0.450–4.500)

## 2021-05-17 LAB — CBC
Hematocrit: 43.1 % (ref 37.5–51.0)
Hemoglobin: 14.4 g/dL (ref 13.0–17.7)
MCH: 29.6 pg (ref 26.6–33.0)
MCHC: 33.4 g/dL (ref 31.5–35.7)
MCV: 89 fL (ref 79–97)
Platelets: 166 10*3/uL (ref 150–450)
RBC: 4.87 x10E6/uL (ref 4.14–5.80)
RDW: 13.6 % (ref 11.6–15.4)
WBC: 4.5 10*3/uL (ref 3.4–10.8)

## 2021-05-18 LAB — VITAMIN D 25 HYDROXY (VIT D DEFICIENCY, FRACTURES): Vit D, 25-Hydroxy: 41.7 ng/mL (ref 30.0–100.0)

## 2021-05-18 LAB — VITAMIN B12: Vitamin B-12: 614 pg/mL (ref 232–1245)

## 2021-06-09 LAB — HEPATIC FUNCTION PANEL
ALT: 17 IU/L (ref 0–44)
AST: 23 IU/L (ref 0–40)
Albumin: 4.5 g/dL (ref 3.7–4.7)
Alkaline Phosphatase: 66 IU/L (ref 44–121)
Bilirubin Total: 0.5 mg/dL (ref 0.0–1.2)
Bilirubin, Direct: 0.14 mg/dL (ref 0.00–0.40)
Total Protein: 6.4 g/dL (ref 6.0–8.5)

## 2021-06-09 LAB — LIPID PANEL
Chol/HDL Ratio: 2.7 ratio (ref 0.0–5.0)
Cholesterol, Total: 143 mg/dL (ref 100–199)
HDL: 53 mg/dL (ref >39)
LDL Chol Calc (NIH): 79 mg/dL (ref 0–99)
Triglycerides: 49 mg/dL (ref 0–149)
VLDL Cholesterol Cal: 11 mg/dL (ref 5–40)

## 2021-06-09 LAB — SPECIMEN STATUS REPORT

## 2021-07-26 ENCOUNTER — Encounter: Payer: Self-pay | Admitting: Emergency Medicine

## 2021-07-26 ENCOUNTER — Ambulatory Visit (INDEPENDENT_AMBULATORY_CARE_PROVIDER_SITE_OTHER): Payer: PPO | Admitting: Emergency Medicine

## 2021-07-26 VITALS — BP 110/68 | HR 67 | Temp 97.6°F | Ht 75.0 in | Wt 221.5 lb

## 2021-07-26 DIAGNOSIS — Z7689 Persons encountering health services in other specified circumstances: Secondary | ICD-10-CM | POA: Diagnosis not present

## 2021-07-26 DIAGNOSIS — Z7901 Long term (current) use of anticoagulants: Secondary | ICD-10-CM

## 2021-07-26 DIAGNOSIS — C61 Malignant neoplasm of prostate: Secondary | ICD-10-CM | POA: Diagnosis not present

## 2021-07-26 DIAGNOSIS — E538 Deficiency of other specified B group vitamins: Secondary | ICD-10-CM

## 2021-07-26 DIAGNOSIS — G3184 Mild cognitive impairment, so stated: Secondary | ICD-10-CM | POA: Diagnosis not present

## 2021-07-26 DIAGNOSIS — I4819 Other persistent atrial fibrillation: Secondary | ICD-10-CM | POA: Diagnosis not present

## 2021-07-26 DIAGNOSIS — Z8639 Personal history of other endocrine, nutritional and metabolic disease: Secondary | ICD-10-CM | POA: Insufficient documentation

## 2021-07-26 DIAGNOSIS — E785 Hyperlipidemia, unspecified: Secondary | ICD-10-CM | POA: Diagnosis not present

## 2021-07-26 DIAGNOSIS — D6869 Other thrombophilia: Secondary | ICD-10-CM | POA: Diagnosis not present

## 2021-07-26 LAB — CBC WITH DIFFERENTIAL/PLATELET
Basophils Absolute: 0 10*3/uL (ref 0.0–0.1)
Basophils Relative: 0.7 % (ref 0.0–3.0)
Eosinophils Absolute: 0.1 10*3/uL (ref 0.0–0.7)
Eosinophils Relative: 1.2 % (ref 0.0–5.0)
HCT: 42.7 % (ref 39.0–52.0)
Hemoglobin: 14.2 g/dL (ref 13.0–17.0)
Lymphocytes Relative: 30.5 % (ref 12.0–46.0)
Lymphs Abs: 1.3 10*3/uL (ref 0.7–4.0)
MCHC: 33.2 g/dL (ref 30.0–36.0)
MCV: 91.6 fl (ref 78.0–100.0)
Monocytes Absolute: 0.5 10*3/uL (ref 0.1–1.0)
Monocytes Relative: 10.5 % (ref 3.0–12.0)
Neutro Abs: 2.5 10*3/uL (ref 1.4–7.7)
Neutrophils Relative %: 57.1 % (ref 43.0–77.0)
Platelets: 154 10*3/uL (ref 150.0–400.0)
RBC: 4.66 Mil/uL (ref 4.22–5.81)
RDW: 13.9 % (ref 11.5–15.5)
WBC: 4.3 10*3/uL (ref 4.0–10.5)

## 2021-07-26 LAB — VITAMIN D 25 HYDROXY (VIT D DEFICIENCY, FRACTURES): VITD: 53.41 ng/mL (ref 30.00–100.00)

## 2021-07-26 LAB — COMPREHENSIVE METABOLIC PANEL
ALT: 18 U/L (ref 0–53)
AST: 20 U/L (ref 0–37)
Albumin: 4.3 g/dL (ref 3.5–5.2)
Alkaline Phosphatase: 57 U/L (ref 39–117)
BUN: 15 mg/dL (ref 6–23)
CO2: 28 mEq/L (ref 19–32)
Calcium: 9.3 mg/dL (ref 8.4–10.5)
Chloride: 105 mEq/L (ref 96–112)
Creatinine, Ser: 1.01 mg/dL (ref 0.40–1.50)
GFR: 74.42 mL/min (ref >60.00)
Glucose, Bld: 85 mg/dL (ref 70–99)
Potassium: 4.1 mEq/L (ref 3.5–5.1)
Sodium: 140 mEq/L (ref 135–145)
Total Bilirubin: 0.9 mg/dL (ref 0.2–1.2)
Total Protein: 6.6 g/dL (ref 6.0–8.3)

## 2021-07-26 LAB — LIPID PANEL
Cholesterol: 139 mg/dL (ref 0–200)
HDL: 53.9 mg/dL (ref >39.00)
LDL Cholesterol: 74 mg/dL (ref 0–99)
NonHDL: 85.59
Total CHOL/HDL Ratio: 3
Triglycerides: 58 mg/dL (ref 0.0–149.0)
VLDL: 11.6 mg/dL (ref 0.0–40.0)

## 2021-07-26 LAB — VITAMIN B12: Vitamin B-12: 694 pg/mL (ref 211–911)

## 2021-07-26 NOTE — Progress Notes (Signed)
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  First visit to this office. Has the following chronic medical problems: 1.  Persistent atrial fibrillation on Eliquis.  No medication for rate or rhythm control needed.  Sees cardiologist on a regular basis 2.  History of prostate cancer: Stable.  Sees urologist on a regular basis 3.  Dyslipidemia: On rosuvastatin 4.  Mild cognitive impairment on Namenda and Aricept.  Stable and doing very well Other complaints or medical concerns today. States he had colonoscopy about 4 or 5 years ago and was told to return in 10 years.  HPI   Prior to Admission medications   Medication Sig Start Date End Date Taking? Authorizing Provider  apixaban (ELIQUIS) 5 MG TABS tablet Take 1 tablet (5 mg total) by mouth 2 (two) times daily. 02/16/21  Yes Jettie Booze, MD  Cholecalciferol (VITAMIN D3 PO) Take 2,000 Units by mouth daily.   Yes [provider]  Cyanocobalamin 1000 MCG CAPS 1 ml 11/09/19  Yes [provider]  donepezil (ARICEPT) 10 MG tablet Take 1 tablet (10 mg total) by mouth at bedtime. 11/28/20  Yes Marcial Pacas, MD  memantine (NAMENDA) 10 MG tablet Take 1 tablet (10 mg total) by mouth 2 (two) times daily. 11/28/20  Yes Marcial Pacas, MD  rosuvastatin (CRESTOR) 10 MG tablet Take 1 tablet (10 mg total) by mouth daily. 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   Pernicious anemia 03/24/2020   Personal history of colonic polyps 03/24/2020   Prostate cancer ([place]) 03/24/2020   Pure hypercholesterolemia 03/24/2020   Sleep disturbance 03/24/2020   Vitamin D deficiency 03/24/2020   Vitamin B12 deficiency 02/16/2020   Persistent atrial fibrillation (HCC)     Past Medical History:  Diagnosis Date   BPH (benign prostatic hyperplasia)    Hyperlipidemia    Mild cognitive impairment    Nocturia    Persistent atrial fibrillation (HCC)    Prostate cancer ([place])    Vitamin B12 deficiency 02/16/2020   Wears glasses     Past Surgical History:  Procedure Laterality Date   CARDIOVERSION N/A 06/13/2017   Procedure: CARDIOVERSION;  Surgeon: Sanda Klein, MD;  Location: [facility];  Service: Cardiovascular;  Laterality: N/A;   SEPTOPLASTY  [place] N/A 09/15/2015   Procedure: TRANSURETHRAL RESECTION OF THE PROSTATE (TURP);  Surgeon: Carolan Clines, MD;  Location: [facility];  Service: Urology;  Laterality: N/A;    Social History   Socioeconomic History   Marital status: Married    Spouse name: Bethena Roys   Number of children: Not on file   Years of education: Not on file   Highest education level: Not on file  Occupational History   Occupation: Physician    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  Lives with spouse in [place].   Social Determinants of Health   Financial Resource Strain: Not on file  Food Insecurity: Not on file  Transportation Needs: Not on file  Physical Activity: Not on file  Stress: Not on file  Social Connections:  Not on file  Intimate Partner Violence: Not on file    Family History  Problem Relation Age of Onset   Breast cancer Neg Hx    Prostate cancer Neg Hx    Colon cancer Neg Hx    Pancreatic cancer Neg Hx      Review of Systems  Constitutional: Negative.  Negative for chills and fever.  HENT: Negative.  Negative for congestion and sore throat.   Respiratory: Negative.  Negative for cough and shortness of breath.   Cardiovascular: Negative.  Negative for chest pain, palpitations and leg swelling.  Gastrointestinal: Negative.  Negative for abdominal pain, diarrhea, nausea and vomiting.  Genitourinary: Negative.   Skin: Negative.  Negative for rash.  Neurological:  Negative for dizziness and headaches.  All other systems reviewed and are negative.  Today's Vitals   07/26/21 0906  BP: 110/68  Pulse: 67  Temp: 97.6 F (36.4 C)  TempSrc: Oral  SpO2: 98%  Weight: 221 lb 8 oz (100.5 kg)  Height: '6\' 3"'$  (1.905 m)   Body mass index is 27.69 kg/m.  Physical Exam Vitals reviewed.  Constitutional:      Appearance: Normal appearance.  HENT:     Head: Normocephalic.     Right Ear: Tympanic membrane, ear canal and external ear normal.     Left Ear: Tympanic membrane, ear canal and external ear normal.     Mouth/Throat:     Mouth: Mucous membranes are moist.     Pharynx: Oropharynx is clear.  Eyes:     Extraocular Movements: Extraocular movements intact.     Conjunctiva/sclera: Conjunctivae normal.     Pupils: Pupils are equal, round, and reactive to light.  Cardiovascular:     Rate and Rhythm: Normal rate. Rhythm irregular.     Pulses: Normal pulses.     Heart sounds: Normal heart sounds.  Pulmonary:     Effort: Pulmonary effort is normal.     Breath sounds: Normal breath sounds.  Abdominal:     Palpations: Abdomen is soft.     Tenderness: There is no abdominal tenderness.  Musculoskeletal:     Cervical back: No tenderness.     Right lower leg: No edema.     Left lower  leg: No edema.  Lymphadenopathy:     Cervical: No cervical adenopathy.  Skin:    General: Skin is warm and dry.     Capillary Refill: Capillary refill takes less than 2 seconds.  Neurological:     General: No focal deficit present.     Mental Status: He is alert and oriented to person, place, and time.  Psychiatric:        Mood and Affect: Mood normal.        Behavior: Behavior normal.    ASSESSMENT & PLAN: A total of 60 minutes was spent with the patient and counseling/coordination of care regarding preparing for this  visit, review of available medical records, review of multiple chronic medical problems and their management, review of all medications, comprehensive history and physical examination, need for blood work, prognosis, documentation and need for follow-up.  Problem List Items Addressed This Visit       Cardiovascular and Mediastinum   Persistent atrial fibrillation ([place]) - Primary    Well-controlled ventricular rate.  Not taking rate or rhythm control medications at present time.  No need for medications. Unsuccessful cardioversion in the past. Long-term anticoagulation with Eliquis 5 mg twice a day. No bleeding problems or complications. Sees cardiologist on a regular basis. Stable condition.       Relevant Orders   CBC with Differential/Platelet   Comprehensive metabolic panel   Hemoglobin A1c   VITAMIN D 25 Hydroxy (Vit-D Deficiency, Fractures)     Nervous and Auditory   Mild cognitive impairment    Stable and doing well with Aricept and Namenda. Sees neurologist on a regular basis.        Genitourinary   Prostate cancer ([place])    Stable and under surveillance.  Sees urologist on a regular basis.        Hematopoietic and Hemostatic   Other thrombophilia ([place])     Other   Vitamin B12 deficiency   Relevant Orders   Vitamin B12   Dyslipidemia    Stable.  Continue rosuvastatin 10 mg daily.      Relevant Orders   Lipid panel   Other Visit  Diagnoses     Encounter to establish care       Current use of long term anticoagulation          Patient Instructions  Health Maintenance After Age 37 After age 6, you are at a higher risk for certain long-term diseases and infections as well as injuries from falls. Falls are a major cause of broken bones and head injuries in people who are older than age 51. Getting regular preventive care can help to keep you healthy and well. Preventive care includes getting regular testing and making lifestyle changes as recommended by your health care provider. Talk with your health care provider about: Which screenings and tests you should have. A screening is a test that checks for a disease when you have no symptoms. A diet and exercise plan that is right for you. What should I know about screenings and tests to prevent falls? Screening and testing are the best ways to find a health problem early. Early diagnosis and treatment give you the best chance of managing medical conditions that are common after age 79. Certain conditions and lifestyle choices may make you more likely to have a fall. Your health care provider may recommend: Regular vision checks. Poor vision and conditions such as cataracts can make you more likely to have a fall. If you wear glasses, make sure to get your prescription updated if your vision changes. Medicine review. Work with your health care provider to regularly review all of the medicines you are taking, including over-the-counter medicines. Ask your health care provider about any side effects that may make you more likely to have a fall. Tell your health care provider if any medicines that you take make you feel dizzy or sleepy. Strength and balance checks. Your health care provider may recommend certain tests to check your strength and balance while standing, walking, or changing positions. Foot health exam. Foot pain and numbness, as well as not wearing proper footwear, can  make you more likely  to have a fall. Screenings, including: Osteoporosis screening. Osteoporosis is a condition that causes the bones to get weaker and break more easily. Blood pressure screening. Blood pressure changes and medicines to control blood pressure can make you feel dizzy. Depression screening. You may be more likely to have a fall if you have a fear of falling, feel depressed, or feel unable to do activities that you used to do. Alcohol use screening. Using too much alcohol can affect your balance and may make you more likely to have a fall. Follow these instructions at home: Lifestyle Do not drink alcohol if: Your health care provider tells you not to drink. If you drink alcohol: Limit how much you have to: 0-1 drink a day for women. 0-2 drinks a day for men. Know how much alcohol is in your drink. In the [place] one drink equals one 12 oz bottle of beer (355 mL), one 5 oz glass of wine (148 mL), or one 1 oz glass of hard liquor (44 mL). Do not use any products that contain nicotine or tobacco. These products include cigarettes, chewing tobacco, and vaping devices, such as e-cigarettes. If you need help quitting, ask your health care provider. Activity  Follow a regular exercise program to stay fit. This will help you maintain your balance. Ask your health care provider what types of exercise are appropriate for you. If you need a cane or walker, use it as recommended by your health care provider. Wear supportive shoes that have nonskid soles. Safety  Remove any tripping hazards, such as rugs, cords, and clutter. Install safety equipment such as grab bars in bathrooms and safety rails on stairs. Keep rooms and walkways well-lit. General instructions Talk with your health care provider about your risks for falling. Tell your health care provider if: You fall. Be sure to tell your health care provider about all falls, even ones that seem minor. You feel dizzy, tiredness  (fatigue), or off-balance. Take over-the-counter and prescription medicines only as told by your health care provider. These include supplements. Eat a healthy diet and maintain a healthy weight. A healthy diet includes low-fat dairy products, low-fat (lean) meats, and fiber from whole grains, beans, and lots of fruits and vegetables. Stay current with your vaccines. Schedule regular health, dental, and eye exams. Summary Having a healthy lifestyle and getting preventive care can help to protect your health and wellness after age 66. Screening and testing are the best way to find a health problem early and help you avoid having a fall. Early diagnosis and treatment give you the best chance for managing medical conditions that are more common for people who are older than age 59. Falls are a major cause of broken bones and head injuries in people who are older than age 61. Take precautions to prevent a fall at home. Work with your health care provider to learn what changes you can make to improve your health and wellness and to prevent falls. This information is not intended to replace advice given to you by your health care provider. Make sure you discuss any questions you have with your health care provider. Document Revised: 06/20/2020 Document Reviewed: 06/20/2020 Elsevier Patient Education  [place], MD [facility]

## 2021-07-26 NOTE — Assessment & Plan Note (Signed)
Stable.  Continue rosuvastatin 10 mg daily. 

## 2021-07-26 NOTE — Assessment & Plan Note (Signed)
Stable and under surveillance.  Sees urologist on a regular basis.

## 2021-07-26 NOTE — Telephone Encounter (Signed)
Note not needed 

## 2021-07-26 NOTE — Patient Instructions (Signed)
Health Maintenance After Age 72 After age 72, you are at a higher risk for certain long-term diseases and infections as well as injuries from falls. Falls are a major cause of broken bones and head injuries in people who are older than age 72. Getting regular preventive care can help to keep you healthy and well. Preventive care includes getting regular testing and making lifestyle changes as recommended by your health care provider. Talk with your health care provider about: Which screenings and tests you should have. A screening is a test that checks for a disease when you have no symptoms. A diet and exercise plan that is right for you. What should I know about screenings and tests to prevent falls? Screening and testing are the best ways to find a health problem early. Early diagnosis and treatment give you the best chance of managing medical conditions that are common after age 72. Certain conditions and lifestyle choices may make you more likely to have a fall. Your health care provider may recommend: Regular vision checks. Poor vision and conditions such as cataracts can make you more likely to have a fall. If you wear glasses, make sure to get your prescription updated if your vision changes. Medicine review. Work with your health care provider to regularly review all of the medicines you are taking, including over-the-counter medicines. Ask your health care provider about any side effects that may make you more likely to have a fall. Tell your health care provider if any medicines that you take make you feel dizzy or sleepy. Strength and balance checks. Your health care provider may recommend certain tests to check your strength and balance while standing, walking, or changing positions. Foot health exam. Foot pain and numbness, as well as not wearing proper footwear, can make you more likely to have a fall. Screenings, including: Osteoporosis screening. Osteoporosis is a condition that causes  the bones to get weaker and break more easily. Blood pressure screening. Blood pressure changes and medicines to control blood pressure can make you feel dizzy. Depression screening. You may be more likely to have a fall if you have a fear of falling, feel depressed, or feel unable to do activities that you used to do. Alcohol use screening. Using too much alcohol can affect your balance and may make you more likely to have a fall. Follow these instructions at home: Lifestyle Do not drink alcohol if: Your health care provider tells you not to drink. If you drink alcohol: Limit how much you have to: 0-1 drink a day for women. 0-2 drinks a day for men. Know how much alcohol is in your drink. In the [place] one drink equals one 12 oz bottle of beer (355 mL), one 5 oz glass of wine (148 mL), or one 1 oz glass of hard liquor (44 mL). Do not use any products that contain nicotine or tobacco. These products include cigarettes, chewing tobacco, and vaping devices, such as e-cigarettes. If you need help quitting, ask your health care provider. Activity  Follow a regular exercise program to stay fit. This will help you maintain your balance. Ask your health care provider what types of exercise are appropriate for you. If you need a cane or walker, use it as recommended by your health care provider. Wear supportive shoes that have nonskid soles. Safety  Remove any tripping hazards, such as rugs, cords, and clutter. Install safety equipment such as grab bars in bathrooms and safety rails on stairs. Keep rooms and walkways   well-lit. General instructions Talk with your health care provider about your risks for falling. Tell your health care provider if: You fall. Be sure to tell your health care provider about all falls, even ones that seem minor. You feel dizzy, tiredness (fatigue), or off-balance. Take over-the-counter and prescription medicines only as told by your health care provider. These include  supplements. Eat a healthy diet and maintain a healthy weight. A healthy diet includes low-fat dairy products, low-fat (lean) meats, and fiber from whole grains, beans, and lots of fruits and vegetables. Stay current with your vaccines. Schedule regular health, dental, and eye exams. Summary Having a healthy lifestyle and getting preventive care can help to protect your health and wellness after age 72. Screening and testing are the best way to find a health problem early and help you avoid having a fall. Early diagnosis and treatment give you the best chance for managing medical conditions that are more common for people who are older than age 72. Falls are a major cause of broken bones and head injuries in people who are older than age 72. Take precautions to prevent a fall at home. Work with your health care provider to learn what changes you can make to improve your health and wellness and to prevent falls. This information is not intended to replace advice given to you by your health care provider. Make sure you discuss any questions you have with your health care provider. Document Revised: 06/20/2020 Document Reviewed: 06/20/2020 Elsevier Patient Education  2023 Elsevier Inc.  

## 2021-07-26 NOTE — Assessment & Plan Note (Signed)
Stable and doing well with Aricept and Namenda. Sees neurologist on a regular basis.

## 2021-07-26 NOTE — Assessment & Plan Note (Signed)
Well-controlled ventricular rate.  Not taking rate or rhythm control medications at present time.  No need for medications. Unsuccessful cardioversion in the past. Long-term anticoagulation with Eliquis 5 mg twice a day. No bleeding problems or complications. Sees cardiologist on a regular basis. Stable condition.

## 2021-07-27 ENCOUNTER — Telehealth: Payer: Self-pay | Admitting: Interventional Cardiology

## 2021-07-27 MED ORDER — APIXABAN 5 MG PO TABS: 5.0000 mg | ORAL_TABLET | Freq: Two times a day (BID) | ORAL | 1 refills | Status: DC

## 2021-07-27 NOTE — Telephone Encounter (Signed)
*  STAT* If patient is at the pharmacy, call can be transferred to refill team.   1. Which medications need to be refilled? (please list name of each medication and dose if known)   rosuvastatin (CRESTOR) 10 MG tablet   2. Which pharmacy/location (including street and city if local pharmacy) is medication to be sent to? [facility] [id number] - [place], [place] 3. Do they need a 30 day or 90 day supply? [place]

## 2021-07-27 NOTE — Telephone Encounter (Signed)
Pt last saw Dr Irish Lack 02/16/21, last labs 07/26/21 Creat 1.01, age 72, weight 100.5kg, based on specified criteria pt is on appropriate dosage of Eliquis '5mg'$  BID for afib.  Will refill rx.

## 2021-07-27 NOTE — Telephone Encounter (Signed)
 *  STAT* If patient is at the pharmacy, call can be transferred to refill team.   1. Which medications need to be refilled? (please list name of each medication and dose if known) apixaban (ELIQUIS) 5 MG TABS tablet  2. Which pharmacy/location (including street and city if local pharmacy) is medication to be sent to? [facility] [id number] - [place], [place]  3. Do they need a 30 day or 90 day supply? 90 days  Pt is only have few pills left, requesting to get refill today

## 2021-07-27 NOTE — Telephone Encounter (Signed)
Pt's medication was already sent to pt's pharmacy as requested. Confirmation received.  

## 2021-09-01 DIAGNOSIS — C61 Malignant neoplasm of prostate: Secondary | ICD-10-CM | POA: Diagnosis not present

## 2021-09-08 DIAGNOSIS — C61 Malignant neoplasm of prostate: Secondary | ICD-10-CM | POA: Diagnosis not present

## 2021-09-08 DIAGNOSIS — N3941 Urge incontinence: Secondary | ICD-10-CM | POA: Diagnosis not present

## 2021-09-13 DIAGNOSIS — L820 Inflamed seborrheic keratosis: Secondary | ICD-10-CM | POA: Diagnosis not present

## 2021-10-23 DIAGNOSIS — L820 Inflamed seborrheic keratosis: Secondary | ICD-10-CM | POA: Diagnosis not present

## 2021-10-23 DIAGNOSIS — D225 Melanocytic nevi of trunk: Secondary | ICD-10-CM | POA: Diagnosis not present

## 2021-10-23 DIAGNOSIS — D485 Neoplasm of uncertain behavior of skin: Secondary | ICD-10-CM | POA: Diagnosis not present

## 2021-10-23 DIAGNOSIS — D2262 Melanocytic nevi of left upper limb, including shoulder: Secondary | ICD-10-CM | POA: Diagnosis not present

## 2021-10-23 DIAGNOSIS — Z1283 Encounter for screening for malignant neoplasm of skin: Secondary | ICD-10-CM | POA: Diagnosis not present

## 2021-11-09 ENCOUNTER — Telehealth: Payer: Self-pay | Admitting: Interventional Cardiology

## 2021-11-09 NOTE — Telephone Encounter (Signed)
Pt c/o medication issue:  1. Name of Medication: apixaban (ELIQUIS) 5 MG TABS tablet rosuvastatin (CRESTOR) 10 MG tablet  2. How are you currently taking this medication (dosage and times per day)? As prescribed   3. Are you having a reaction (difficulty breathing--STAT)?   4. What is your medication issue?  Pt is moving and would like a call back to discuss getting a written prescription to take with him for his move until he find a pharmacy and new doctors. Requesting call back.

## 2021-11-09 NOTE — Telephone Encounter (Signed)
Called pt advised to call our office with the name of new pharmacy when it is time to refill medications.  Our office will send in refill to pharmacy of pt choice.  Pt a little hesitant but is agreeable to plan.

## 2021-11-11 ENCOUNTER — Other Ambulatory Visit: Payer: Self-pay | Admitting: Interventional Cardiology

## 2021-11-28 ENCOUNTER — Encounter: Payer: Self-pay | Admitting: Neurology

## 2021-11-28 ENCOUNTER — Ambulatory Visit: Payer: PPO | Admitting: Neurology

## 2021-11-28 VITALS — BP 145/71 | HR 76 | Ht 75.0 in | Wt 220.0 lb

## 2021-11-28 DIAGNOSIS — G3184 Mild cognitive impairment, so stated: Secondary | ICD-10-CM | POA: Diagnosis not present

## 2021-11-28 DIAGNOSIS — E538 Deficiency of other specified B group vitamins: Secondary | ICD-10-CM

## 2021-11-28 MED ORDER — MEMANTINE HCL 10 MG PO TABS: 10.0000 mg | ORAL_TABLET | Freq: Two times a day (BID) | ORAL | 4 refills | Status: AC

## 2021-11-28 MED ORDER — DONEPEZIL HCL 10 MG PO TABS: 10.0000 mg | ORAL_TABLET | Freq: Every day | ORAL | 4 refills | Status: DC

## 2021-11-28 NOTE — Progress Notes (Signed)
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  Mild cognitive impairment  MoCA examination 25/30 on November 28, 2021,  Age-related change versus very early stage of central nervous system degenerative disorder versus stress related, I did not seeing any parkinsonian features, which his wife worry about  MRI of the brain in January 2022 showed mild to moderate atrophy, no acute abnormalities  Laboratory evaluation showed no treatable etiology, on B12 supplement  He and his wife will move to [place], will no longer follow-up in our clinic  Refilled Aricept 10 mg daily, Namenda 10  twice a day  Continue moderate exercise      DIAGNOSTIC DATA (LABS, IMAGING, TESTING) - I reviewed patient records, labs, notes, testing and imaging myself where available.  MRI brain (without) on March 01, 2020 - Mild-moderate perisylvian and mesial temporal atrophy. - Slight asymmetry of right midbrain, smaller than left side, as noted above.  Considerations include underlying congenital etiology, developmental venous anomaly or enlarged perivascular spaces.  Consider follow-up MRI with and without contrast as clinically indicated. - No acute findings.    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 PMHx. Afib, on eliquis. 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  He was seen by Dr. Jannifer Franklin in January 2022,  Personally reviewed MRI of the brain in January 2022, no acute abnormality, mild to moderate perisylvian and mesial temporal lobe atrophy,  He reported mildly low vitamin  B12, improved with supplements, otherwise no other treatable etiology found  He was started on Aricept 5 mg following visit in January 2022, but he only take it intermittently  His wife is very anxious about his cognitive change, came in with a list of symptoms, such as loss to [place] from [place] even with GPS, difficulty handling oven,  Patient eat healthy diet, exercise regularly, denies difficulty sleeping,  His wife seems very anxious about his symptoms  UPDATE Nov 28 2021: He is with his wife at today's clinical visit, they are going to drive to [place] moved to their new home care today after clinical visit  Wife is very frustrated about his slow worsening memory loss, low reaction time, and himself seems to last worry about it, MoCA examination 25/30, today, missed 4 out of 5 short-term recall,  He can drive without getting lost, still manage household finances, mild gait abnormality contributed to his knee pain,   PHYSICAL EXAM:   Vitals:   11/28/21 1115  BP: (!) 145/71  Pulse: 76  Weight: 220 lb (99.8 kg)  Height: '6\' 3"'$  (1.905 m)   Not recorded     Body mass index is 27.5 kg/m.  PHYSICAL EXAMNIATION:  Gen: NAD, conversant, well nourised, well groomed                     Cardiovascular: Regular rate rhythm, no peripheral edema, warm, nontender. Eyes: Conjunctivae clear without exudates or hemorrhage Neck: Supple, no carotid bruits. Pulmonary: Clear to auscultation bilaterally   NEUROLOGICAL EXAM:  MENTAL STATUS: Speech:    Speech is normal; fluent and spontaneous  with normal comprehension.  Cognition:         11/28/2021   11:19 AM 11/28/2020    7:30 AM  Montreal Cognitive Assessment   Visuospatial/ Executive (0/5) 4 5  Naming (0/3) 3 3  Attention: Read list of digits (0/2) 2 2  Attention: Read list of letters (0/1) 1 1  Attention: Serial 7 subtraction starting at 100 (0/3) 3 3  Language: Repeat phrase (0/2) 2 2  Language : Fluency (0/1) 1 1   Abstraction (0/2) 2 2  Delayed Recall (0/5) 1 5  Orientation (0/6) 6 6  Total 25 30  Adjusted Score (based on education)  30      CRANIAL NERVES: CN II: Visual fields are full to confrontation. Pupils are round equal and briskly reactive to light. CN III, IV, VI: extraocular movement are normal. No ptosis. CN V: Facial sensation is intact to light touch CN VII: Face is symmetric with normal eye closure  CN VIII: Hearing is normal to causal conversation. CN IX, X: Phonation is normal. CN XI: Head turning and shoulder shrug are intact  MOTOR: There is no pronator drift of out-stretched arms. Muscle bulk and tone are normal. Muscle strength is normal.  REFLEXES: Reflexes are 1 and symmetric at the biceps, triceps, knees, and ankles. Plantar responses are flexor.  SENSORY: Intact to light touch, pinprick and vibratory sensation are intact in fingers and toes.  COORDINATION: There is no trunk or limb dysmetria noted.  GAIT/STANCE: Need push-up to get up from seated position, mildly antalgic due to knee pain, being forward   REVIEW OF SYSTEMS:  Full 14 system review of systems performed and notable only for as above All other review of systems were negative.   ALLERGIES: No Known Allergies  HOME MEDICATIONS: Current Outpatient Medications  Medication Sig Dispense Refill   apixaban (ELIQUIS) 5 MG TABS tablet Take 1 tablet (5 mg total) by mouth 2 (two) times daily. 180 tablet 1   Cyanocobalamin 1000 MCG CAPS 1 ml     donepezil (ARICEPT) 10 MG tablet Take 1 tablet (10 mg total) by mouth at bedtime. 90 tablet 4   memantine (NAMENDA) 10 MG tablet Take 1 tablet (10 mg total) by mouth 2 (two) times daily. 180 tablet 4   rosuvastatin (CRESTOR) 10 MG tablet TAKE ONE TABLET BY MOUTH DAILY 90 tablet 3   Cholecalciferol (VITAMIN D3 PO) Take 2,000 Units by mouth daily.     No current facility-administered medications for this visit.    PAST MEDICAL HISTORY: Past Medical History:   Diagnosis Date   BPH (benign prostatic hyperplasia)    Hyperlipidemia    Mild cognitive impairment    Nocturia    Persistent atrial fibrillation (HCC)    Prostate cancer ([place])    Vitamin B12 deficiency 02/16/2020   Wears glasses     PAST SURGICAL HISTORY: Past Surgical History:  Procedure Laterality Date   CARDIOVERSION N/A 06/13/2017   Procedure: CARDIOVERSION;  Surgeon: Sanda Klein, MD;  Location: [facility];  Service: Cardiovascular;  Laterality: N/A;   [place] N/A 09/15/2015   Procedure: TRANSURETHRAL RESECTION OF THE PROSTATE (TURP);  Surgeon: Carolan Clines, MD;  Location: [facility];  Service: Urology;  Laterality: N/A;    FAMILY HISTORY: Family History  Problem Relation Age of Onset   Breast cancer Neg Hx    Prostate cancer Neg Hx    Colon cancer Neg Hx  Pancreatic cancer Neg Hx     SOCIAL HISTORY: Social History   Socioeconomic History   Marital status: Married    Spouse name: Bethena Roys   Number of children: Not on file   Years of education: Not on file   Highest education level: Not on file  Occupational History   Occupation: Physician    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  Lives with spouse in [place].   Social Determinants of Health   Financial Resource Strain: Not on file  Food Insecurity: Not on file  Transportation Needs: Not on file  Physical Activity: Not on file  Stress: Not on file  Social Connections: Not on file  Intimate Partner Violence: Not on file      Marcial Pacas, M.D. Ph.D.  [facility] [address], [place], [place] [address] Ph: [phone] Fax: [phone]  CC:  Vernie Shanks, MD No address on file  Horald Pollen, MD

## 2022-01-25 ENCOUNTER — Ambulatory Visit: Payer: PPO | Admitting: Emergency Medicine

## 2022-05-01 ENCOUNTER — Telehealth: Payer: Self-pay | Admitting: Interventional Cardiology

## 2022-05-01 ENCOUNTER — Other Ambulatory Visit: Payer: Self-pay

## 2022-05-01 DIAGNOSIS — I4821 Permanent atrial fibrillation: Secondary | ICD-10-CM

## 2022-05-01 MED ORDER — ROSUVASTATIN CALCIUM 10 MG PO TABS: 10.0000 mg | ORAL_TABLET | Freq: Every day | ORAL | 0 refills | Status: DC

## 2022-05-01 NOTE — Telephone Encounter (Signed)
Prescription refill request for Eliquis received. Indication: afib  Last office visit: Varanasi, 02/16/2021 Scr: 1.01 07/26/2021 Age: 72 yo  Weight: 99.8 kg   Pt overdue to see cardiologist. Msg sent to schedulers.

## 2022-05-01 NOTE — Telephone Encounter (Signed)
*  STAT* If patient is at the pharmacy, call can be transferred to refill team.   1. Which medications need to be refilled? (please list name of each medication and dose if known) apixaban (ELIQUIS) 5 MG TABS tablet   2. Which pharmacy/location (including street and city if local pharmacy) is medication to be sent to?  [id number] - [place], [place]    3. Do they need a 30 day or 90 day supply? 90 day

## 2022-05-02 ENCOUNTER — Other Ambulatory Visit: Payer: Self-pay | Admitting: Interventional Cardiology

## 2022-05-02 MED ORDER — APIXABAN 5 MG PO TABS: 5.0000 mg | ORAL_TABLET | Freq: Two times a day (BID) | ORAL | 0 refills | Status: DC

## 2022-05-02 NOTE — Telephone Encounter (Signed)
Pt has scheduled appt with Ambrose Pancoast, NP on 06/06/22. Refill sent. Called pt and made him aware.

## 2022-05-15 ENCOUNTER — Other Ambulatory Visit: Payer: Self-pay | Admitting: Interventional Cardiology

## 2022-05-15 DIAGNOSIS — I4821 Permanent atrial fibrillation: Secondary | ICD-10-CM

## 2022-05-15 NOTE — Telephone Encounter (Signed)
Prescription refill request for Eliquis received. Indication: Afib  Last office visit: 02/16/21 Irish Lack)  Scr: 1.01 (07/26/21)  Age: 72 Weight: 99.8kg  Overdue for office visit. Pt has a scheduled appt on 06/06/22.   Appropriate dose. Refill sent.

## 2022-05-17 ENCOUNTER — Telehealth: Payer: Self-pay | Admitting: Interventional Cardiology

## 2022-05-17 DIAGNOSIS — I4821 Permanent atrial fibrillation: Secondary | ICD-10-CM

## 2022-05-17 NOTE — Telephone Encounter (Signed)
*  STAT* If patient is at the pharmacy, call can be transferred to refill team.   1. Which medications need to be refilled? (please list name of each medication and dose if known)   ELIQUIS 5 MG TABS tablet   2. Which pharmacy/location (including street and city if local pharmacy) is medication to be sent to?  [id number] - [place], [place] - [address]   3. Do they need a 30 day or 90 day supply?   90 day  Patient stated he is almost out of this medication.

## 2022-05-18 MED ORDER — APIXABAN 5 MG PO TABS: 5.0000 mg | ORAL_TABLET | Freq: Two times a day (BID) | ORAL | 0 refills | Status: DC

## 2022-05-18 NOTE — Telephone Encounter (Signed)
Prescription refill request for Eliquis received. Indication: Afib  Last office visit: 02/16/21 Brady Martinez)  Scr: 1.01 (07/23/21)  Age: 72 Weight: 99.8kg  Office visit overdue - pt has scheduled appt with Brady Searing, NP on 06/06/22. Appropriate dose. Refill sent.

## 2022-06-06 ENCOUNTER — Ambulatory Visit: Payer: PPO | Admitting: Nurse Practitioner

## 2022-11-13 ENCOUNTER — Other Ambulatory Visit: Payer: Self-pay | Admitting: Interventional Cardiology

## 2022-11-13 DIAGNOSIS — I4821 Permanent atrial fibrillation: Secondary | ICD-10-CM

## 2022-11-13 NOTE — Telephone Encounter (Signed)
Prescription refill request for Eliquis received. Indication:afib Last office visit:needs appt ZOX:WRUEA labs Age: 72 Weight:99.8  kg  Prescription refilled

## 2023-01-09 ENCOUNTER — Other Ambulatory Visit: Payer: Self-pay | Admitting: Interventional Cardiology

## 2023-01-09 ENCOUNTER — Other Ambulatory Visit: Payer: Self-pay | Admitting: Neurology

## 2023-01-11 ENCOUNTER — Other Ambulatory Visit: Payer: Self-pay

## 2023-01-11 MED ORDER — DONEPEZIL HCL 10 MG PO TABS: 10.0000 mg | ORAL_TABLET | Freq: Every day | ORAL | 1 refills | Status: AC

## 2023-08-21 ENCOUNTER — Other Ambulatory Visit: Payer: Self-pay | Admitting: Interventional Cardiology
